# Patient Record
Sex: Female | Born: 1960
Health system: Southern US, Community
[De-identification: ages and names within clinical notes are randomized; demographics above are authoritative.]

## PROBLEM LIST (undated history)

## (undated) DIAGNOSIS — E119 Type 2 diabetes mellitus without complications: Secondary | ICD-10-CM

## (undated) DIAGNOSIS — E785 Hyperlipidemia, unspecified: Secondary | ICD-10-CM

## (undated) DIAGNOSIS — G4730 Sleep apnea, unspecified: Secondary | ICD-10-CM

## (undated) DIAGNOSIS — Z972 Presence of dental prosthetic device (complete) (partial): Secondary | ICD-10-CM

## (undated) HISTORY — DX: Type 2 diabetes mellitus without complications: E11.9

## (undated) HISTORY — DX: Hyperlipidemia, unspecified: E78.5

## (undated) HISTORY — PX: NO PAST SURGERIES: SHX2092

---

## 2017-04-06 DIAGNOSIS — H5213 Myopia, bilateral: Secondary | ICD-10-CM | POA: Diagnosis not present

## 2017-04-06 LAB — HM DIABETES EYE EXAM

## 2017-04-12 ENCOUNTER — Ambulatory Visit (INDEPENDENT_AMBULATORY_CARE_PROVIDER_SITE_OTHER): Payer: 59 | Admitting: Internal Medicine

## 2017-04-12 ENCOUNTER — Encounter: Payer: Self-pay | Admitting: Internal Medicine

## 2017-04-12 VITALS — BP 124/78 | HR 77 | Temp 98.3°F | Ht 67.0 in | Wt 260.0 lb

## 2017-04-12 DIAGNOSIS — Z1231 Encounter for screening mammogram for malignant neoplasm of breast: Secondary | ICD-10-CM

## 2017-04-12 DIAGNOSIS — H6123 Impacted cerumen, bilateral: Secondary | ICD-10-CM | POA: Diagnosis not present

## 2017-04-12 DIAGNOSIS — Z1239 Encounter for other screening for malignant neoplasm of breast: Secondary | ICD-10-CM

## 2017-04-12 DIAGNOSIS — E782 Mixed hyperlipidemia: Secondary | ICD-10-CM | POA: Diagnosis not present

## 2017-04-12 DIAGNOSIS — R7303 Prediabetes: Secondary | ICD-10-CM | POA: Diagnosis not present

## 2017-04-12 DIAGNOSIS — Z1211 Encounter for screening for malignant neoplasm of colon: Secondary | ICD-10-CM

## 2017-04-12 NOTE — Progress Notes (Signed)
Date:  04/12/2017   Name:  Karla Chapman   DOB:  01/19/1961   MRN:  [id number]   Chief Complaint: Establish Care (New to area- PCP needed in area. )  Prediabetes - she has tested borderline for A1c about 2 years ago.  She changed her diet but never needed medication.  She had lost some weight and has maintained at current weight for past 2 year.  HM - Pap smear was 2 years ago and was normal.  She intended to get a mammogram before moving here but did not.  She denied breast complaints. She has never had a colonoscopy.  She has no bowel problems and no personal hx of bowel disease or polyps, no family history of colon cancer or IBD.  Review of Systems  Constitutional: Negative for chills, fatigue, fever and unexpected weight change.  HENT: Negative for ear pain.   Eyes: Negative for visual disturbance.  Respiratory: Negative for chest tightness and shortness of breath.   Cardiovascular: Negative for chest pain, palpitations and leg swelling.  Gastrointestinal: Negative for abdominal pain, blood in stool, constipation and diarrhea.  Genitourinary: Negative for difficulty urinating and menstrual problem.  Musculoskeletal: Negative for arthralgias.  Skin: Negative for color change and rash.  Neurological: Negative for dizziness and headaches (hx of migraines - none in years).  Psychiatric/Behavioral: Negative for dysphoric mood and sleep disturbance.    There are no active problems to display for this patient.   Prior to Admission medications   Not on File    Allergies  Allergen Reactions  . Penicillins Rash    History reviewed. No pertinent surgical history.  Social History  Substance Use Topics  . Smoking status: Never Smoker  . Smokeless tobacco: Never Used  . Alcohol use Yes     Comment: 1-2 glasses of beer, or wine weekly     Medication list has been reviewed and updated.   Physical Exam  Constitutional: She is oriented to person, place, and time. She  appears well-developed. No distress.  HENT:  Head: Normocephalic and atraumatic.  Right Ear: No decreased hearing is noted.  Left Ear: No decreased hearing is noted.  Nose: Right sinus exhibits no maxillary sinus tenderness and no frontal sinus tenderness. Left sinus exhibits no maxillary sinus tenderness and no frontal sinus tenderness.  Mouth/Throat: No posterior oropharyngeal edema or posterior oropharyngeal erythema.  Cerumen impaction bilaterally  Eyes: Pupils are equal, round, and reactive to light.  Neck: Normal range of motion. Neck supple. Carotid bruit is not present. No thyromegaly present.  Cardiovascular: Normal rate, regular rhythm and normal heart sounds.   Pulmonary/Chest: Effort normal and breath sounds normal. No respiratory distress. She has no wheezes.  Musculoskeletal: Normal range of motion.  Neurological: She is alert and oriented to person, place, and time. She has normal strength. Coordination and gait normal.  Skin: Skin is warm and dry. No rash noted.  Psychiatric: She has a normal mood and affect. Her speech is normal and behavior is normal. Thought content normal.  Nursing note and vitals reviewed.   BP 124/78   Pulse 77   Temp 98.3 F (36.8 C)   Ht 5\' 7"  (1.702 m)   Wt 260 lb (117.9 kg)   SpO2 97%   BMI 40.72 kg/m   Assessment and Plan: 1. Prediabetes Continue low carb diet; reinforce regular exercise - Hemoglobin A1c  2. Mixed hyperlipidemia Noted in the past with low ASCVD risk Continue diet and exercise  3.  Impacted cerumen of both ears Asymptomatic - would ENT to remove cerumen  4. Breast cancer screening Pt to schedule [facility] - MM DIGITAL SCREENING BILATERAL; Future  5. Colon cancer screening Will order cologuard since pt is low risk - she will check on coverage before sending in specimen - Cologuard   No orders of the defined types were placed in this encounter.   Bari Edward, MD [facility]  04/12/2017

## 2017-04-13 LAB — HEMOGLOBIN A1C
ESTIMATED AVERAGE GLUCOSE: 143 mg/dL
HEMOGLOBIN A1C: 6.6 % — AB (ref 4.8–5.6)

## 2017-04-15 ENCOUNTER — Telehealth: Payer: Self-pay

## 2017-04-15 DIAGNOSIS — R7309 Other abnormal glucose: Secondary | ICD-10-CM

## 2017-04-15 NOTE — Telephone Encounter (Signed)
Called patient. Gave results and referral info.  Placed referral.Patient verbalized understanding.

## 2017-05-06 ENCOUNTER — Ambulatory Visit
Admission: RE | Admit: 2017-05-06 | Discharge: 2017-05-06 | Disposition: A | Discharge status: Home / Self Care | Payer: 59 | Source: Ambulatory Visit | Attending: Internal Medicine | Admitting: Internal Medicine

## 2017-05-06 DIAGNOSIS — Z1239 Encounter for other screening for malignant neoplasm of breast: Secondary | ICD-10-CM

## 2017-05-06 DIAGNOSIS — Z1231 Encounter for screening mammogram for malignant neoplasm of breast: Secondary | ICD-10-CM | POA: Diagnosis not present

## 2017-05-10 ENCOUNTER — Other Ambulatory Visit: Payer: Self-pay | Admitting: Internal Medicine

## 2017-05-10 DIAGNOSIS — R928 Other abnormal and inconclusive findings on diagnostic imaging of breast: Secondary | ICD-10-CM

## 2017-05-13 ENCOUNTER — Ambulatory Visit
Admission: RE | Admit: 2017-05-13 | Discharge: 2017-05-13 | Disposition: A | Discharge status: Home / Self Care | Payer: 59 | Source: Ambulatory Visit | Attending: Internal Medicine | Admitting: Internal Medicine

## 2017-05-13 ENCOUNTER — Other Ambulatory Visit: Payer: Self-pay | Admitting: Internal Medicine

## 2017-05-13 DIAGNOSIS — R928 Other abnormal and inconclusive findings on diagnostic imaging of breast: Secondary | ICD-10-CM | POA: Diagnosis not present

## 2017-05-13 DIAGNOSIS — N6324 Unspecified lump in the left breast, lower inner quadrant: Secondary | ICD-10-CM | POA: Diagnosis not present

## 2017-05-13 DIAGNOSIS — N6322 Unspecified lump in the left breast, upper inner quadrant: Secondary | ICD-10-CM | POA: Diagnosis not present

## 2017-07-23 ENCOUNTER — Encounter: Payer: Self-pay | Admitting: Internal Medicine

## 2017-07-25 ENCOUNTER — Other Ambulatory Visit: Payer: Self-pay | Admitting: Internal Medicine

## 2017-07-25 NOTE — Telephone Encounter (Signed)
Patient needs strips called in and lancets[place] Please Advise.

## 2017-07-27 ENCOUNTER — Other Ambulatory Visit: Payer: Self-pay

## 2017-07-27 MED ORDER — GLUCOSE BLOOD VI STRP: ORAL_STRIP | 12 refills | Status: DC

## 2017-08-08 ENCOUNTER — Encounter: Payer: Self-pay | Admitting: Internal Medicine

## 2017-08-08 ENCOUNTER — Other Ambulatory Visit: Payer: Self-pay

## 2017-08-08 MED ORDER — ACCU-CHEK FASTCLIX LANCETS MISC: 1.0000 | Freq: Two times a day (BID) | 3 refills | Status: DC

## 2017-08-19 ENCOUNTER — Ambulatory Visit (INDEPENDENT_AMBULATORY_CARE_PROVIDER_SITE_OTHER): Payer: 59 | Admitting: Internal Medicine

## 2017-08-19 ENCOUNTER — Encounter: Payer: Self-pay | Admitting: Internal Medicine

## 2017-08-19 VITALS — BP 122/68 | HR 65 | Ht 67.0 in | Wt 255.0 lb

## 2017-08-19 DIAGNOSIS — E119 Type 2 diabetes mellitus without complications: Secondary | ICD-10-CM

## 2017-08-19 DIAGNOSIS — E118 Type 2 diabetes mellitus with unspecified complications: Secondary | ICD-10-CM | POA: Insufficient documentation

## 2017-08-19 DIAGNOSIS — Z1211 Encounter for screening for malignant neoplasm of colon: Secondary | ICD-10-CM

## 2017-08-19 MED ORDER — LISINOPRIL 5 MG PO TABS: 5.0000 mg | ORAL_TABLET | Freq: Every day | ORAL | 3 refills | Status: DC

## 2017-08-19 MED ORDER — GLUCOSE BLOOD VI STRP: ORAL_STRIP | 12 refills | Status: DC

## 2017-08-19 NOTE — Progress Notes (Signed)
Date:  08/19/2017   Name:  Karla Chapman   DOB:  10/16/1960   MRN:  [id number]   Chief Complaint: Hyperlipidemia and Diabetes Diabetes  She presents for her follow-up diabetic visit. She has type 2 diabetes mellitus. Pertinent negatives for diabetes include no chest pain, no fatigue, no foot paresthesias and no polyuria. Symptoms are improving. Current diabetic treatment includes diet. She is following a diabetic diet. She has not had a previous visit with a dietitian. She participates in exercise three times a week. She monitors blood glucose at home 1-2 x per day. Blood glucose monitoring compliance is good. Her home blood glucose trend is fluctuating minimally. Her breakfast blood glucose is taken between 9-10 am. Her breakfast blood glucose range is generally 130-140 mg/dl. An ACE inhibitor/angiotensin II receptor blocker is not being taken.      Review of Systems  Constitutional: Negative for chills, fatigue and fever.  Eyes: Negative for visual disturbance.  Respiratory: Negative for cough, chest tightness, shortness of breath and wheezing.   Cardiovascular: Negative for chest pain and leg swelling.  Endocrine: Negative for polyuria.  Musculoskeletal: Negative for arthralgias and gait problem.  Skin: Negative for rash and wound.  Psychiatric/Behavioral: Negative for sleep disturbance.    Patient Active Problem List   Diagnosis Date Noted  . Controlled type 2 diabetes mellitus without complication, without long-term current use of insulin (HCC) 08/19/2017    Prior to Admission medications   Medication Sig Start Date End Date Taking? Authorizing Provider  ACCU-CHEK FASTCLIX LANCETS MISC 1 each by Does not apply route 2 (two) times daily. 08/08/17  Yes Reubin MilanBerglund, Laelynn Blizzard H, MD  glucose blood (ACCU-CHEK GUIDE) test strip Test BS twice a day 07/27/17  Yes Reubin MilanBerglund, Pietrina Jagodzinski H, MD    Allergies  Allergen Reactions  . Penicillins Rash    History reviewed. No pertinent surgical  history.  Social History   Tobacco Use  . Smoking status: Never Smoker  . Smokeless tobacco: Never Used  Substance Use Topics  . Alcohol use: Yes    Comment: 1-2 glasses of beer, or wine weekly   . Drug use: No     Medication list has been reviewed and updated.  PHQ 2/9 Scores 08/19/2017  PHQ - 2 Score 0    Physical Exam  Constitutional: She is oriented to person, place, and time. She appears well-developed. No distress.  HENT:  Head: Normocephalic and atraumatic.  Neck: Normal range of motion. Neck supple. No thyromegaly present.  Cardiovascular: Normal rate, regular rhythm and normal heart sounds.  Pulmonary/Chest: Effort normal and breath sounds normal. No respiratory distress.  Musculoskeletal: Normal range of motion. She exhibits no edema or tenderness.  Neurological: She is alert and oriented to person, place, and time.  Skin: Skin is warm and dry. No rash noted.  Psychiatric: She has a normal mood and affect. Her behavior is normal. Thought content normal.  Nursing note and vitals reviewed.   BP 122/68   Pulse 65   Ht 5\' 7"  (1.702 m)   Wt 255 lb (115.7 kg)   SpO2 96%   BMI 39.94 kg/m   Assessment and Plan: 1. Controlled type 2 diabetes mellitus without complication, without long-term current use of insulin (HCC) Continue diet and exercise Add ASA and ACEI Consider statin - Hemoglobin A1c - Lipid panel - Comprehensive metabolic panel - Microalbumin / creatinine urine ratio - lisinopril (PRINIVIL,ZESTRIL) 5 MG tablet; Take 1 tablet (5 mg total) by mouth daily.  Dispense:  90 tablet; Refill: 3 - glucose blood (ACCU-CHEK GUIDE) test strip; Test BS twice a day  Dispense: 100 each; Refill: 12  2. Colon cancer screening Check on coverage - Cologuard   Meds ordered this encounter  Medications  . lisinopril (PRINIVIL,ZESTRIL) 5 MG tablet    Sig: Take 1 tablet (5 mg total) by mouth daily.    Dispense:  90 tablet    Refill:  3  . glucose blood (ACCU-CHEK  GUIDE) test strip    Sig: Test BS twice a day    Dispense:  100 each    Refill:  12    Partially dictated using Animal nutritionistDragon software. Any errors are unintentional.  Bari EdwardLaura Srikar Chiang, MD [facility]  08/19/2017

## 2017-08-19 NOTE — Patient Instructions (Signed)
Begin Aspirin 81mg daily

## 2017-08-20 LAB — HEMOGLOBIN A1C
Est. average glucose Bld gHb Est-mCnc: 134 mg/dL
Hgb A1c MFr Bld: 6.3 % — ABNORMAL HIGH (ref 4.8–5.6)

## 2017-08-20 LAB — COMPREHENSIVE METABOLIC PANEL
A/G RATIO: 1.7 (ref 1.2–2.2)
ALK PHOS: 86 IU/L (ref 39–117)
ALT: 25 IU/L (ref 0–32)
AST: 19 IU/L (ref 0–40)
Albumin: 4.7 g/dL (ref 3.5–5.5)
BILIRUBIN TOTAL: 0.4 mg/dL (ref 0.0–1.2)
BUN/Creatinine Ratio: 19 (ref 9–23)
BUN: 15 mg/dL (ref 6–24)
CALCIUM: 9.7 mg/dL (ref 8.7–10.2)
CHLORIDE: 101 mmol/L (ref 96–106)
CO2: 24 mmol/L (ref 20–29)
Creatinine, Ser: 0.81 mg/dL (ref 0.57–1.00)
GFR calc Af Amer: 95 mL/min/{1.73_m2} (ref >59)
GFR calc non Af Amer: 82 mL/min/{1.73_m2} (ref >59)
Globulin, Total: 2.7 g/dL (ref 1.5–4.5)
Glucose: 97 mg/dL (ref 65–99)
POTASSIUM: 4.4 mmol/L (ref 3.5–5.2)
Sodium: 138 mmol/L (ref 134–144)
Total Protein: 7.4 g/dL (ref 6.0–8.5)

## 2017-08-20 LAB — LIPID PANEL
CHOL/HDL RATIO: 4.4 ratio (ref 0.0–4.4)
Cholesterol, Total: 230 mg/dL — ABNORMAL HIGH (ref 100–199)
HDL: 52 mg/dL (ref >39)
LDL CALC: 140 mg/dL — AB (ref 0–99)
TRIGLYCERIDES: 190 mg/dL — AB (ref 0–149)
VLDL CHOLESTEROL CAL: 38 mg/dL (ref 5–40)

## 2017-08-23 NOTE — Progress Notes (Signed)
Patient was not fasting. Would like to recheck while fasting.

## 2017-08-24 ENCOUNTER — Other Ambulatory Visit: Payer: Self-pay

## 2017-08-24 DIAGNOSIS — E785 Hyperlipidemia, unspecified: Secondary | ICD-10-CM

## 2017-09-01 DIAGNOSIS — Z1212 Encounter for screening for malignant neoplasm of rectum: Secondary | ICD-10-CM | POA: Diagnosis not present

## 2017-09-02 LAB — COLOGUARD

## 2017-09-09 ENCOUNTER — Encounter: Payer: Self-pay | Admitting: Internal Medicine

## 2017-11-11 ENCOUNTER — Other Ambulatory Visit: Payer: Self-pay | Admitting: Internal Medicine

## 2017-11-11 ENCOUNTER — Ambulatory Visit
Admission: RE | Admit: 2017-11-11 | Discharge: 2017-11-11 | Disposition: A | Discharge status: Home / Self Care | Payer: 59 | Source: Ambulatory Visit | Attending: Internal Medicine | Admitting: Internal Medicine

## 2017-11-11 DIAGNOSIS — N6320 Unspecified lump in the left breast, unspecified quadrant: Secondary | ICD-10-CM

## 2017-11-11 DIAGNOSIS — R928 Other abnormal and inconclusive findings on diagnostic imaging of breast: Secondary | ICD-10-CM

## 2017-12-09 ENCOUNTER — Encounter (HOSPITAL_COMMUNITY): Payer: Self-pay | Admitting: *Deleted

## 2017-12-09 ENCOUNTER — Other Ambulatory Visit: Payer: Self-pay

## 2017-12-09 ENCOUNTER — Emergency Department (HOSPITAL_COMMUNITY): Payer: 59

## 2017-12-09 ENCOUNTER — Emergency Department (HOSPITAL_COMMUNITY)
Admission: EM | Admit: 2017-12-09 | Discharge: 2017-12-09 | Disposition: A | Discharge status: Home / Self Care | Payer: 59 | Attending: Emergency Medicine | Admitting: Emergency Medicine

## 2017-12-09 DIAGNOSIS — M25512 Pain in left shoulder: Secondary | ICD-10-CM | POA: Diagnosis not present

## 2017-12-09 DIAGNOSIS — Z79899 Other long term (current) drug therapy: Secondary | ICD-10-CM | POA: Diagnosis not present

## 2017-12-09 DIAGNOSIS — E119 Type 2 diabetes mellitus without complications: Secondary | ICD-10-CM | POA: Diagnosis not present

## 2017-12-09 DIAGNOSIS — Z7982 Long term (current) use of aspirin: Secondary | ICD-10-CM | POA: Insufficient documentation

## 2017-12-09 DIAGNOSIS — S4992XA Unspecified injury of left shoulder and upper arm, initial encounter: Secondary | ICD-10-CM | POA: Diagnosis not present

## 2017-12-09 DIAGNOSIS — Y999 Unspecified external cause status: Secondary | ICD-10-CM | POA: Insufficient documentation

## 2017-12-09 DIAGNOSIS — Y92410 Unspecified street and highway as the place of occurrence of the external cause: Secondary | ICD-10-CM | POA: Insufficient documentation

## 2017-12-09 DIAGNOSIS — Y939 Activity, unspecified: Secondary | ICD-10-CM | POA: Insufficient documentation

## 2017-12-09 MED ORDER — DICLOFENAC SODIUM 50 MG PO TBEC: 50.0000 mg | DELAYED_RELEASE_TABLET | Freq: Two times a day (BID) | ORAL | 0 refills | Status: DC

## 2017-12-09 MED ORDER — CYCLOBENZAPRINE HCL 10 MG PO TABS: 10.0000 mg | ORAL_TABLET | Freq: Two times a day (BID) | ORAL | 0 refills | Status: DC | PRN
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
## 2017-12-09 NOTE — Discharge Instructions (Signed)
Do not drive while taking the muscle relaxer as it will make you sleepy. Follow up with  your doctor or return here for worsening symptoms.  °
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
## 2017-12-30 ENCOUNTER — Ambulatory Visit: Payer: 59 | Admitting: Internal Medicine

## 2018-02-27 ENCOUNTER — Encounter: Payer: 59 | Admitting: Internal Medicine

## 2018-04-14 ENCOUNTER — Encounter: Payer: 59 | Admitting: Internal Medicine

## 2018-04-18 ENCOUNTER — Ambulatory Visit (INDEPENDENT_AMBULATORY_CARE_PROVIDER_SITE_OTHER): Payer: 59 | Admitting: Internal Medicine

## 2018-04-18 ENCOUNTER — Encounter: Payer: Self-pay | Admitting: Internal Medicine

## 2018-04-18 ENCOUNTER — Other Ambulatory Visit (HOSPITAL_COMMUNITY)
Admission: RE | Admit: 2018-04-18 | Discharge: 2018-04-18 | Disposition: A | Discharge status: Home / Self Care | Payer: 59 | Source: Ambulatory Visit | Attending: Internal Medicine | Admitting: Internal Medicine

## 2018-04-18 ENCOUNTER — Other Ambulatory Visit: Payer: Self-pay | Admitting: Internal Medicine

## 2018-04-18 VITALS — BP 118/74 | HR 85 | Ht 67.0 in | Wt 257.0 lb

## 2018-04-18 DIAGNOSIS — Z124 Encounter for screening for malignant neoplasm of cervix: Secondary | ICD-10-CM

## 2018-04-18 DIAGNOSIS — Z23 Encounter for immunization: Secondary | ICD-10-CM

## 2018-04-18 DIAGNOSIS — E119 Type 2 diabetes mellitus without complications: Secondary | ICD-10-CM | POA: Diagnosis not present

## 2018-04-18 DIAGNOSIS — Z01419 Encounter for gynecological examination (general) (routine) without abnormal findings: Secondary | ICD-10-CM

## 2018-04-18 DIAGNOSIS — Z0000 Encounter for general adult medical examination without abnormal findings: Secondary | ICD-10-CM

## 2018-04-18 LAB — POCT URINALYSIS DIPSTICK
Bilirubin, UA: NEGATIVE
Blood, UA: NEGATIVE
GLUCOSE UA: NEGATIVE
Ketones, UA: NEGATIVE
LEUKOCYTES UA: NEGATIVE
Nitrite, UA: NEGATIVE
PH UA: 7 (ref 5.0–8.0)
Protein, UA: NEGATIVE
Spec Grav, UA: 1.015 (ref 1.010–1.025)
Urobilinogen, UA: 0.2 E.U./dL

## 2018-04-18 MED ORDER — ZOSTER VAC RECOMB ADJUVANTED 50 MCG/0.5ML IM SUSR: 0.5000 mL | Freq: Once | INTRAMUSCULAR | 1 refills | Status: AC

## 2018-04-18 NOTE — Patient Instructions (Signed)

## 2018-04-18 NOTE — Progress Notes (Signed)
Date:  04/18/2018   Name:  Karla Chapman   DOB:  09/06/1960   MRN:  [id number]   Chief Complaint: Annual Exam (Papsmear. ) Karla Chapman is a 57 y.o. female who presents today for her Complete Annual Exam. She feels well. She reports exercising walking 10K steps/day. She reports she is sleeping well. No breast complaints.  She is due for a pap smear.  She is scheduled for 6 mo mammogram and US next month.   Diabetes  She presents for her follow-up diabetic visit. She has type 2 diabetes mellitus. Her disease course has been stable. Pertinent negatives for hypoglycemia include no dizziness, headaches, nervousness/anxiousness or tremors. Pertinent negatives for diabetes include no chest pain, no fatigue, no polydipsia and no polyuria. Current diabetic treatment includes diet. Her weight is stable. She is following a generally healthy diet. She monitors blood glucose at home 1-2 x per week. Her breakfast blood glucose is taken between 7-8 am. Her breakfast blood glucose range is generally 110-130 mg/dl. An ACE inhibitor/angiotensin II receptor blocker is not being taken.   Lab Results  Component Value Date   HGBA1C 6.3 (H) 08/19/2017     Review of Systems  Constitutional: Negative for chills, fatigue and fever.  HENT: Negative for congestion, hearing loss, tinnitus, trouble swallowing and voice change.   Eyes: Negative for visual disturbance.  Respiratory: Negative for cough, chest tightness, shortness of breath and wheezing.   Cardiovascular: Negative for chest pain, palpitations and leg swelling.  Gastrointestinal: Negative for abdominal pain, constipation, diarrhea and vomiting.  Endocrine: Negative for polydipsia and polyuria.  Genitourinary: Negative for dysuria, frequency, genital sores, vaginal bleeding and vaginal discharge.  Musculoskeletal: Negative for arthralgias, gait problem and joint swelling.  Skin: Negative for color change and rash.  Neurological: Negative for  dizziness, tremors, light-headedness and headaches.  Hematological: Negative for adenopathy. Does not bruise/bleed easily.  Psychiatric/Behavioral: Negative for dysphoric mood and sleep disturbance. The patient is not nervous/anxious.     Patient Active Problem List   Diagnosis Date Noted  . Controlled type 2 diabetes mellitus without complication, without long-term current use of insulin (HCC) 08/19/2017    Allergies  Allergen Reactions  . Penicillins Rash    History reviewed. No pertinent surgical history.  Social History   Tobacco Use  . Smoking status: Never Smoker  . Smokeless tobacco: Never Used  Substance Use Topics  . Alcohol use: Yes    Comment: 1-2 glasses of beer, or wine weekly   . Drug use: No     Medication list has been reviewed and updated.  Current Meds  Medication Sig  . ACCU-CHEK FASTCLIX LANCETS MISC 1 each by Does not apply route 2 (two) times daily.  Marland Kitchen. aspirin EC 81 MG tablet Take 81 mg by mouth daily.  Marland Kitchen. glucose blood (ACCU-CHEK GUIDE) test strip Test BS twice a day    PHQ 2/9 Scores 04/18/2018 08/19/2017  PHQ - 2 Score 0 0    Physical Exam  Constitutional: She is oriented to person, place, and time. She appears well-developed and well-nourished. No distress.  HENT:  Head: Normocephalic and atraumatic.  Right Ear: Tympanic membrane and ear canal normal.  Left Ear: Tympanic membrane and ear canal normal.  Nose: Right sinus exhibits no maxillary sinus tenderness. Left sinus exhibits no maxillary sinus tenderness.  Mouth/Throat: Uvula is midline and oropharynx is clear and moist.  Eyes: Conjunctivae and EOM are normal. Right eye exhibits no discharge. Left eye exhibits no  discharge. No scleral icterus.  Neck: Normal range of motion. Carotid bruit is not present. No erythema present. No thyromegaly present.  Cardiovascular: Normal rate, regular rhythm, normal heart sounds and normal pulses.  Pulmonary/Chest: Effort normal. No respiratory  distress. She has no wheezes.  Abdominal: Soft. Bowel sounds are normal. There is no hepatosplenomegaly. There is no tenderness. There is no CVA tenderness.  Genitourinary: Vagina normal and uterus normal. There is no rash, tenderness or lesion on the right labia. There is no rash, tenderness or lesion on the left labia. Cervix exhibits no motion tenderness, no discharge and no friability. Right adnexum displays no mass, no tenderness and no fullness. Left adnexum displays no mass, no tenderness and no fullness.  Musculoskeletal: Normal range of motion.  Lymphadenopathy:    She has no cervical adenopathy.    She has no axillary adenopathy.  Neurological: She is alert and oriented to person, place, and time. She has normal reflexes. No cranial nerve deficit or sensory deficit.  Skin: Skin is warm, dry and intact. No rash noted.  Psychiatric: She has a normal mood and affect. Her speech is normal and behavior is normal. Thought content normal.  Nursing note and vitals reviewed.   BP 118/74   Pulse 85   Ht 5\' 7"  (1.702 m)   Wt 257 lb (116.6 kg)   SpO2 97%   BMI 40.25 kg/m   Assessment and Plan: 1. Annual physical exam Continue regular exercise, work on weight loss Follow up mammogram and US next month - POCT urinalysis dipstick  2. Encounter for Papanicolaou smear for cervical cancer screening - Cytology - PAP - Microalbumin / creatinine urine ratio  3. Controlled type 2 diabetes mellitus without complication, without long-term current use of insulin (HCC) Check labs and advise - CBC with Differential/Platelet - Comprehensive metabolic panel - Hemoglobin A1c - Lipid panel - TSH  4. Need for shingles vaccine - Zoster Vaccine Adjuvanted [facility](SHINGRIX) injection; Inject 0.5 mLs into the muscle once for 1 dose.  Dispense: 0.5 mL; Refill: 1  5. Need for pneumococcal vaccination - Pneumococcal polysaccharide vaccine 23-valent greater than or equal to 2yo subcutaneous/IM   Meds  ordered this encounter  Medications  . Zoster Vaccine Adjuvanted [facility](SHINGRIX) injection    Sig: Inject 0.5 mLs into the muscle once for 1 dose.    Dispense:  0.5 mL    Refill:  1    Partially dictated using Animal nutritionistDragon software. Any errors are unintentional.  Bari EdwardLaura Berglund, MD [facility]  04/18/2018

## 2018-04-19 DIAGNOSIS — E119 Type 2 diabetes mellitus without complications: Secondary | ICD-10-CM | POA: Diagnosis not present

## 2018-04-19 LAB — CYTOLOGY - PAP: Diagnosis: NEGATIVE

## 2018-04-20 LAB — CBC WITH DIFFERENTIAL/PLATELET
BASOS: 1 %
Basophils Absolute: 0 10*3/uL (ref 0.0–0.2)
EOS (ABSOLUTE): 0.3 10*3/uL (ref 0.0–0.4)
EOS: 5 %
HEMATOCRIT: 37.5 % (ref 34.0–46.6)
HEMOGLOBIN: 12.6 g/dL (ref 11.1–15.9)
IMMATURE GRANULOCYTES: 0 %
Immature Grans (Abs): 0 10*3/uL (ref 0.0–0.1)
Lymphocytes Absolute: 2 10*3/uL (ref 0.7–3.1)
Lymphs: 38 %
MCH: 30 pg (ref 26.6–33.0)
MCHC: 33.6 g/dL (ref 31.5–35.7)
MCV: 89 fL (ref 79–97)
MONOS ABS: 0.6 10*3/uL (ref 0.1–0.9)
Monocytes: 11 %
Neutrophils Absolute: 2.4 10*3/uL (ref 1.4–7.0)
Neutrophils: 45 %
Platelets: 281 10*3/uL (ref 150–450)
RBC: 4.2 x10E6/uL (ref 3.77–5.28)
RDW: 15.4 % (ref 12.3–15.4)
WBC: 5.2 10*3/uL (ref 3.4–10.8)

## 2018-04-20 LAB — COMPREHENSIVE METABOLIC PANEL
ALBUMIN: 4.3 g/dL (ref 3.5–5.5)
ALT: 36 IU/L — ABNORMAL HIGH (ref 0–32)
AST: 28 IU/L (ref 0–40)
Albumin/Globulin Ratio: 1.8 (ref 1.2–2.2)
Alkaline Phosphatase: 81 IU/L (ref 39–117)
BUN / CREAT RATIO: 12 (ref 9–23)
BUN: 10 mg/dL (ref 6–24)
Bilirubin Total: 0.5 mg/dL (ref 0.0–1.2)
CALCIUM: 9.3 mg/dL (ref 8.7–10.2)
CO2: 22 mmol/L (ref 20–29)
CREATININE: 0.85 mg/dL (ref 0.57–1.00)
Chloride: 105 mmol/L (ref 96–106)
GFR calc Af Amer: 89 mL/min/{1.73_m2} (ref >59)
GFR, EST NON AFRICAN AMERICAN: 77 mL/min/{1.73_m2} (ref >59)
GLOBULIN, TOTAL: 2.4 g/dL (ref 1.5–4.5)
Glucose: 149 mg/dL — ABNORMAL HIGH (ref 65–99)
POTASSIUM: 4.6 mmol/L (ref 3.5–5.2)
SODIUM: 139 mmol/L (ref 134–144)
Total Protein: 6.7 g/dL (ref 6.0–8.5)

## 2018-04-20 LAB — HEMOGLOBIN A1C
ESTIMATED AVERAGE GLUCOSE: 137 mg/dL
Hgb A1c MFr Bld: 6.4 % — ABNORMAL HIGH (ref 4.8–5.6)

## 2018-04-20 LAB — LIPID PANEL
CHOL/HDL RATIO: 4.4 ratio (ref 0.0–4.4)
Cholesterol, Total: 208 mg/dL — ABNORMAL HIGH (ref 100–199)
HDL: 47 mg/dL (ref >39)
LDL CALC: 139 mg/dL — AB (ref 0–99)
Triglycerides: 111 mg/dL (ref 0–149)
VLDL CHOLESTEROL CAL: 22 mg/dL (ref 5–40)

## 2018-04-20 LAB — TSH: TSH: 1.57 u[IU]/mL (ref 0.450–4.500)

## 2018-04-21 LAB — MICROALBUMIN / CREATININE URINE RATIO
CREATININE, UR: 128.3 mg/dL
MICROALB/CREAT RATIO: 10.4 mg/g{creat} (ref 0.0–30.0)
Microalbumin, Urine: 13.3 ug/mL

## 2018-05-15 ENCOUNTER — Ambulatory Visit
Admission: RE | Admit: 2018-05-15 | Discharge: 2018-05-15 | Disposition: A | Discharge status: Home / Self Care | Payer: 59 | Source: Ambulatory Visit | Attending: Internal Medicine | Admitting: Internal Medicine

## 2018-05-15 DIAGNOSIS — N6320 Unspecified lump in the left breast, unspecified quadrant: Secondary | ICD-10-CM

## 2018-05-15 DIAGNOSIS — N6001 Solitary cyst of right breast: Secondary | ICD-10-CM | POA: Diagnosis not present

## 2018-05-15 DIAGNOSIS — R928 Other abnormal and inconclusive findings on diagnostic imaging of breast: Secondary | ICD-10-CM | POA: Diagnosis not present

## 2018-10-20 ENCOUNTER — Encounter: Payer: Self-pay | Admitting: Internal Medicine

## 2018-10-20 ENCOUNTER — Other Ambulatory Visit: Payer: Self-pay

## 2018-10-20 ENCOUNTER — Ambulatory Visit (INDEPENDENT_AMBULATORY_CARE_PROVIDER_SITE_OTHER): Payer: 59 | Admitting: Internal Medicine

## 2018-10-20 VITALS — BP 128/72 | HR 97 | Ht 67.0 in | Wt 266.3 lb

## 2018-10-20 DIAGNOSIS — E119 Type 2 diabetes mellitus without complications: Secondary | ICD-10-CM | POA: Diagnosis not present

## 2018-10-20 NOTE — Progress Notes (Signed)
Date:  10/20/2018   Name:  Karla Chapman   DOB:  1961/05/19   MRN:  [id number]   Chief Complaint: Diabetes  Diabetes  She presents for her follow-up diabetic visit. She has type 2 diabetes mellitus. Her disease course has been stable. Pertinent negatives for hypoglycemia include no headaches or tremors. Pertinent negatives for diabetes include no chest pain, no fatigue, no polydipsia and no polyuria. Her weight is stable. She is following a generally healthy diet. She participates in exercise three times a week. An ACE inhibitor/angiotensin II receptor blocker is not being taken. Eye exam is not current.   Lab Results  Component Value Date   HGBA1C 6.4 (H) 04/19/2018     Review of Systems  Constitutional: Negative for appetite change, fatigue, fever and unexpected weight change.  HENT: Negative for tinnitus and trouble swallowing.   Eyes: Negative for visual disturbance.  Respiratory: Negative for cough, chest tightness and shortness of breath.   Cardiovascular: Negative for chest pain, palpitations and leg swelling.  Gastrointestinal: Negative for abdominal pain.  Endocrine: Negative for polydipsia and polyuria.  Genitourinary: Negative for dysuria and hematuria.  Musculoskeletal: Negative for arthralgias.  Neurological: Negative for tremors, numbness and headaches.  Psychiatric/Behavioral: Negative for dysphoric mood.    Patient Active Problem List   Diagnosis Date Noted  . Controlled type 2 diabetes mellitus without complication, without long-term current use of insulin (HCC) 08/19/2017    Allergies  Allergen Reactions  . Penicillins Rash    History reviewed. No pertinent surgical history.  Social History   Tobacco Use  . Smoking status: Never Smoker  . Smokeless tobacco: Never Used  Substance Use Topics  . Alcohol use: Yes    Comment: 1-2 glasses of beer, or wine weekly   . Drug use: No     Medication list has been reviewed and updated.  Current  Meds  Medication Sig  . ACCU-CHEK FASTCLIX LANCETS MISC 1 each by Does not apply route 2 (two) times daily.  . Garlic (GARLIQUE PO) Take by mouth.  Marland Kitchen glucose blood (ACCU-CHEK GUIDE) test strip Test BS twice a day  . Omega-3 Fatty Acids (FISH OIL) 1000 MG CPDR Take by mouth.    PHQ 2/9 Scores 10/20/2018 04/18/2018 08/19/2017  PHQ - 2 Score 0 0 0   Wt Readings from Last 3 Encounters:  10/20/18 266 lb 4.8 oz (120.8 kg)  04/18/18 257 lb (116.6 kg)  08/19/17 255 lb (115.7 kg)    Physical Exam Vitals signs and nursing note reviewed.  Constitutional:      General: She is not in acute distress.    Appearance: She is well-developed.  HENT:     Head: Normocephalic and atraumatic.  Neck:     Musculoskeletal: Normal range of motion and neck supple.     Vascular: No carotid bruit.  Cardiovascular:     Rate and Rhythm: Normal rate and regular rhythm.     Pulses: Normal pulses.  Pulmonary:     Effort: Pulmonary effort is normal. No respiratory distress.     Breath sounds: Normal breath sounds.  Musculoskeletal: Normal range of motion.     Right lower leg: No edema.     Left lower leg: No edema.  Lymphadenopathy:     Cervical: No cervical adenopathy.  Skin:    General: Skin is warm and dry.     Findings: No rash.  Neurological:     Mental Status: She is alert and oriented to person, place,  and time.  Psychiatric:        Behavior: Behavior normal.        Thought Content: Thought content normal.     BP 128/72   Pulse 97   Ht 5\' 7"  (1.702 m)   Wt 266 lb 4.8 oz (120.8 kg)   SpO2 96%   BMI 41.71 kg/m   Assessment and Plan: 1. Controlled type 2 diabetes mellitus without complication, without long-term current use of insulin (HCC) Continue healthy diet and exercise Work on weight loss Schedule DM eye exam - Hemoglobin A1c - Basic metabolic panel   Partially dictated using Animal nutritionist. Any errors are unintentional.  Bari Edward, MD [facility]  10/20/2018

## 2018-10-21 LAB — BASIC METABOLIC PANEL
BUN/Creatinine Ratio: 13 (ref 9–23)
BUN: 11 mg/dL (ref 6–24)
CO2: 23 mmol/L (ref 20–29)
CREATININE: 0.84 mg/dL (ref 0.57–1.00)
Calcium: 9.3 mg/dL (ref 8.7–10.2)
Chloride: 102 mmol/L (ref 96–106)
GFR calc Af Amer: 90 mL/min/{1.73_m2} (ref >59)
GFR, EST NON AFRICAN AMERICAN: 78 mL/min/{1.73_m2} (ref >59)
Glucose: 199 mg/dL — ABNORMAL HIGH (ref 65–99)
Potassium: 4.4 mmol/L (ref 3.5–5.2)
Sodium: 139 mmol/L (ref 134–144)

## 2018-10-21 LAB — HEMOGLOBIN A1C
Est. average glucose Bld gHb Est-mCnc: 166 mg/dL
Hgb A1c MFr Bld: 7.4 % — ABNORMAL HIGH (ref 4.8–5.6)

## 2019-03-14 IMAGING — US ULTRASOUND LEFT BREAST LIMITED
1 series · 5 of 5 positions shown · non-contrast
Comparison: Previous exam(s).
COMPARISON: Previous exam(s).
COMPARISON: Previous exam(s).

Addendum:
CLINICAL DATA: Patient's physician palpated an abnormality in the 1
o'clock region of the right breast.

EXAM:
DIGITAL DIAGNOSTIC BILATERAL MAMMOGRAM WITH CAD AND TOMO
ULTRASOUND RIGHT BREAST
CLINICAL DATA: Short-term interval follow-up of a probable benign
mass in the left breast.
ULTRASOUND LEFT BREAST

[Series 1: ultrasound left breast limited · 0.06mm/px · 5 of 5 slices shown]
[im 1/5]
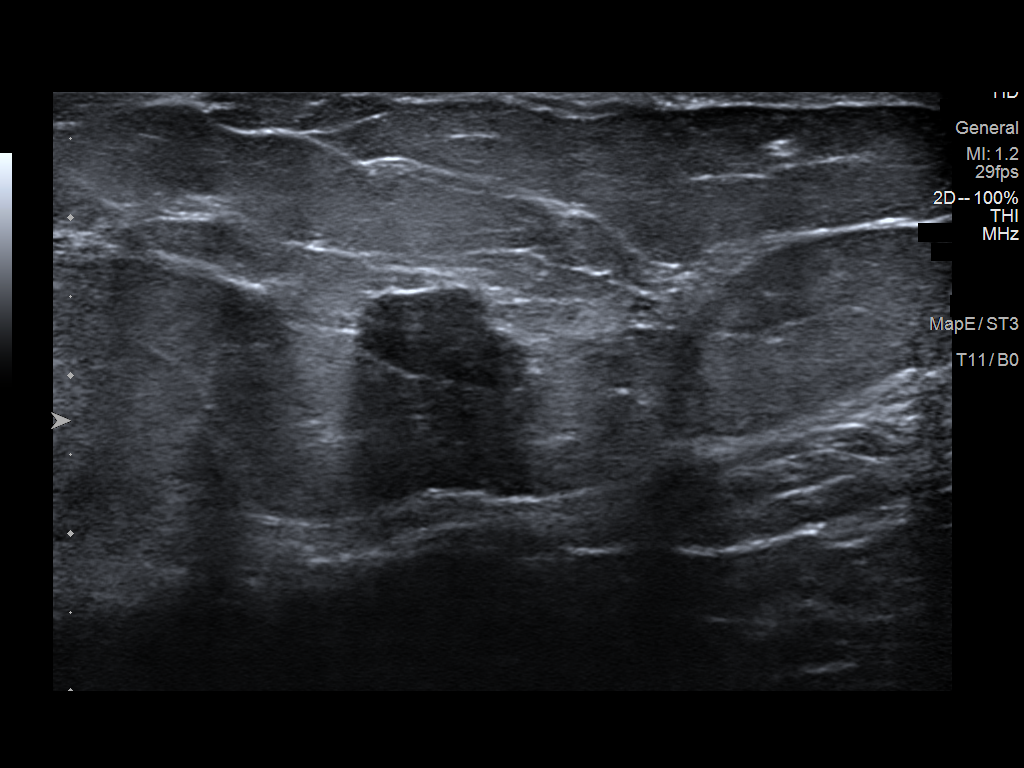
[im 2/5]
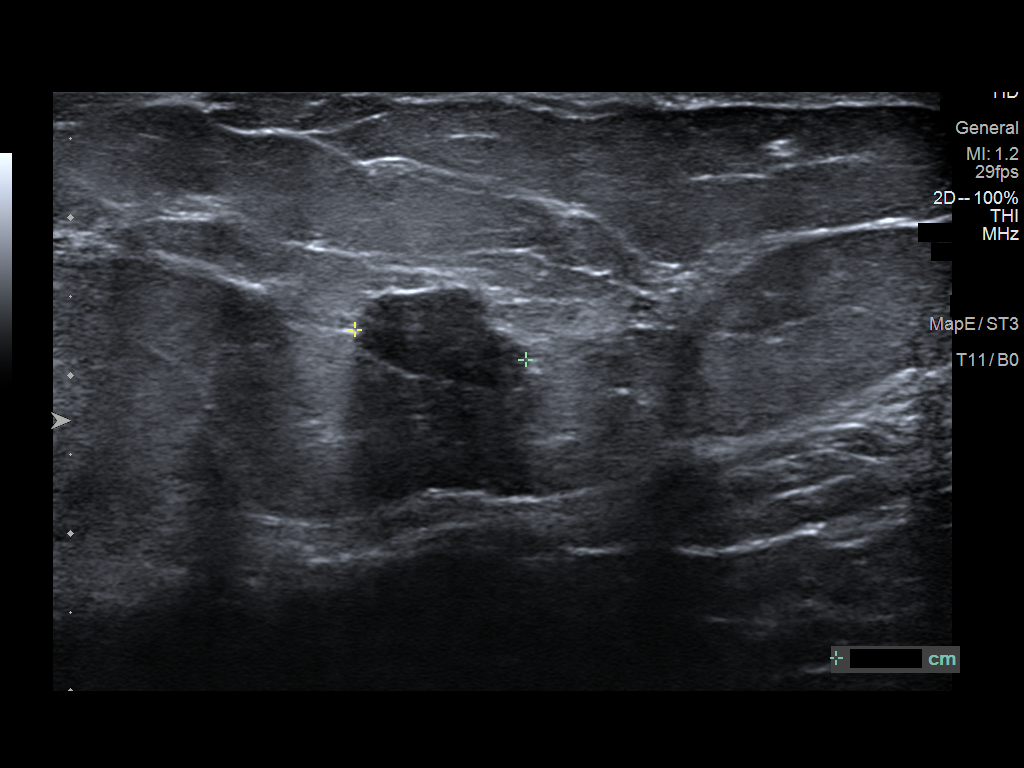
[im 3/5]
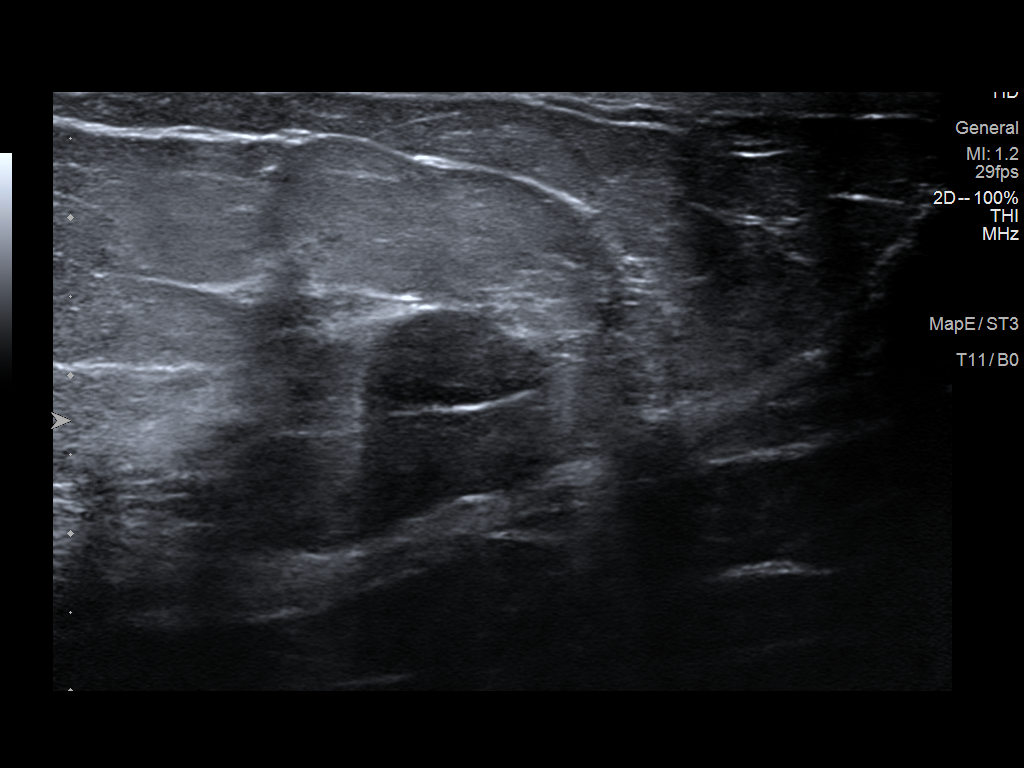
[im 4/5]
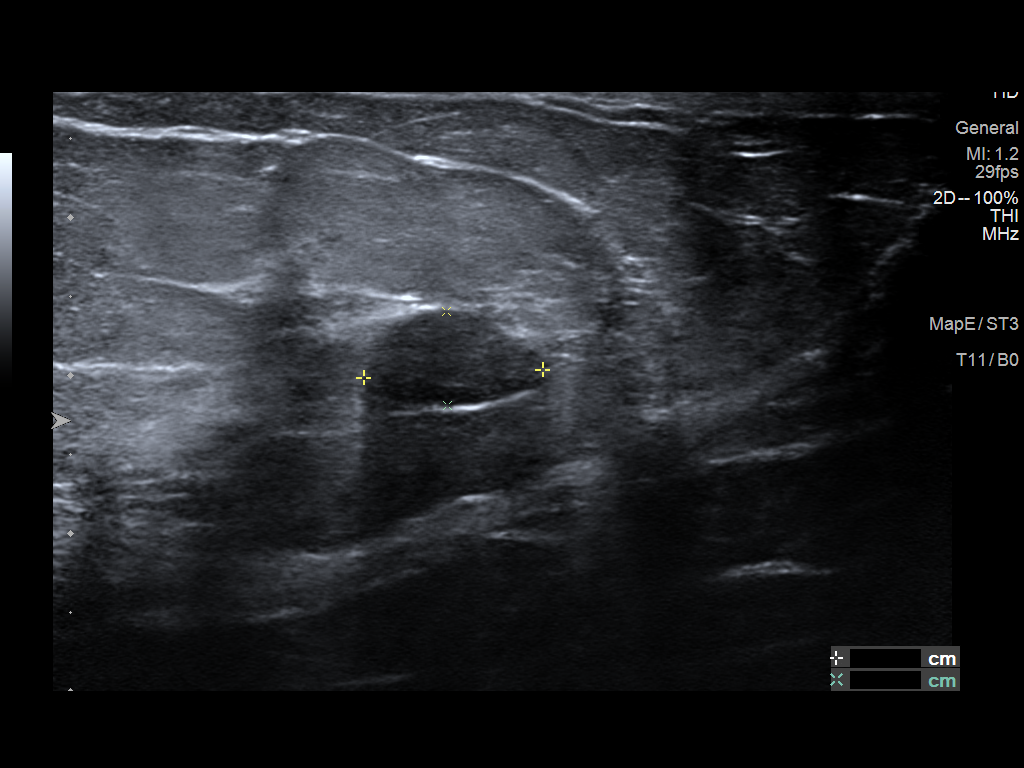
[im 5/5]
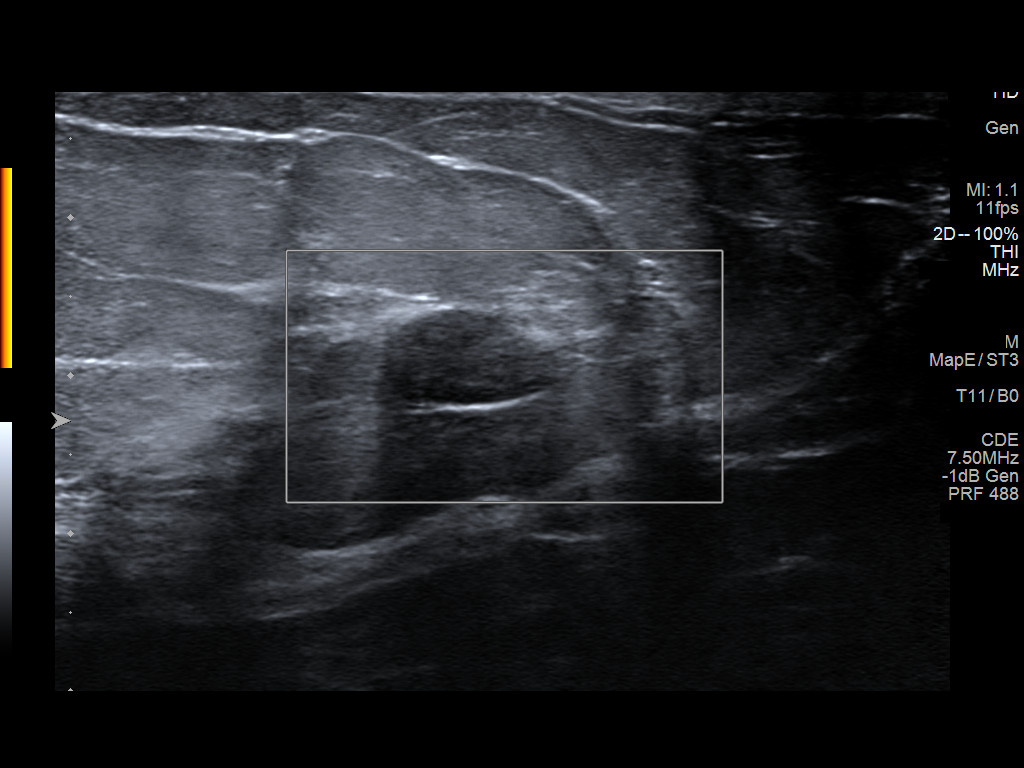

[5 of 5 positions shown; findings below may reference images not displayed]

ACR Breast Density Category c: The breast tissue is heterogeneously
dense, which may obscure small masses.
FINDINGS: There is a well-circumscribed 6 mm mass in the upper-outer quadrant
of right breast seen on the MLO view. No additional mass is seen in
the right breast. There are no malignant type microcalcifications in
the right breast. No suspicious mass or malignant type
microcalcifications identified in the left breast.

Mammographic images were processed with CAD.

On physical exam, no mass is palpated in the 1 o'clock region of the
right breast or the upper-outer quadrant of the right breast.

Targeted ultrasound is performed, showing normal dense
fibroglandular tissue in the area of clinical concern in the right
breast at 1 o'clock 7 cm from the nipple. There is a
well-circumscribed anechoic cyst in the right breast at 10 o'clock
10 cm from the nipple measuring 6 x 3 x 5 mm.
IMPRESSION: No evidence of malignancy in either breast.

RECOMMENDATION:
Bilateral screening mammogram in 1 year is recommended.

I have discussed the findings and recommendations with the patient.
Results were also provided in writing at the conclusion of the
visit. If applicable, a reminder letter will be sent to the patient
regarding the next appointment.

BI-RADS CATEGORY  2: Benign.

:
Addendum: Please disregard the ultrasound report dated 05/15/2018.
The correct report is on the mammogram study dated 05/15/2018
dictated as follows:
ACR Breast Density Category b: There are scattered areas of
fibroglandular density.
FINDINGS: There is a stable well-circumscribed mass in the medial aspect of
the left breast. No suspicious mass or malignant type
microcalcifications identified in either breast.

Mammographic images were processed with CAD.

On physical exam, no mass is palpated in the 9 o'clock region of the
left breast.

Targeted ultrasound is performed, showing a well-circumscribed
hypoechoic mass in the left breast at 9 o'clock 1 cm from the nipple
measuring 1.1 x 0.6 x 1.1 cm. On the prior ultrasound dated
05/13/2017 it measured 1.1 x 0.6 x 1.2 cm.
IMPRESSION: Stable probable fibroadenoma in the left breast.

RECOMMENDATION:
Bilateral diagnostic mammogram and left breast ultrasound in 1 year
is recommended.

I have discussed the findings and recommendations with the patient.
Results were also provided in writing at the conclusion of the
visit. If applicable, a reminder letter will be sent to the patient
regarding the next appointment.

BI-RADS CATEGORY  3: Probably benign.

*** End of Addendum ***
ACR Breast Density Category c: The breast tissue is heterogeneously
dense, which may obscure small masses.
FINDINGS: There is a well-circumscribed 6 mm mass in the upper-outer quadrant
of right breast seen on the MLO view. No additional mass is seen in
the right breast. There are no malignant type microcalcifications in
the right breast. No suspicious mass or malignant type
microcalcifications identified in the left breast.

Mammographic images were processed with CAD.

On physical exam, no mass is palpated in the 1 o'clock region of the
right breast or the upper-outer quadrant of the right breast.

Targeted ultrasound is performed, showing normal dense
fibroglandular tissue in the area of clinical concern in the right
breast at 1 o'clock 7 cm from the nipple. There is a
well-circumscribed anechoic cyst in the right breast at 10 o'clock
10 cm from the nipple measuring 6 x 3 x 5 mm.
IMPRESSION: No evidence of malignancy in either breast.

RECOMMENDATION:
Bilateral screening mammogram in 1 year is recommended.

I have discussed the findings and recommendations with the patient.
Results were also provided in writing at the conclusion of the
visit. If applicable, a reminder letter will be sent to the patient
regarding the next appointment.

BI-RADS CATEGORY  2: Benign.

## 2019-04-19 ENCOUNTER — Other Ambulatory Visit: Payer: Self-pay | Admitting: Internal Medicine

## 2019-04-19 ENCOUNTER — Encounter: Payer: Self-pay | Admitting: Internal Medicine

## 2019-04-19 ENCOUNTER — Other Ambulatory Visit: Payer: Self-pay

## 2019-04-19 ENCOUNTER — Ambulatory Visit (INDEPENDENT_AMBULATORY_CARE_PROVIDER_SITE_OTHER): Payer: 59 | Admitting: Internal Medicine

## 2019-04-19 VITALS — BP 124/76 | HR 78 | Ht 67.0 in | Wt 248.0 lb

## 2019-04-19 DIAGNOSIS — Z0000 Encounter for general adult medical examination without abnormal findings: Secondary | ICD-10-CM | POA: Diagnosis not present

## 2019-04-19 DIAGNOSIS — E1169 Type 2 diabetes mellitus with other specified complication: Secondary | ICD-10-CM | POA: Diagnosis not present

## 2019-04-19 DIAGNOSIS — Z6841 Body Mass Index (BMI) 40.0 and over, adult: Secondary | ICD-10-CM | POA: Insufficient documentation

## 2019-04-19 DIAGNOSIS — Z6838 Body mass index (BMI) 38.0-38.9, adult: Secondary | ICD-10-CM

## 2019-04-19 DIAGNOSIS — E785 Hyperlipidemia, unspecified: Secondary | ICD-10-CM

## 2019-04-19 DIAGNOSIS — E119 Type 2 diabetes mellitus without complications: Secondary | ICD-10-CM | POA: Diagnosis not present

## 2019-04-19 DIAGNOSIS — Z1231 Encounter for screening mammogram for malignant neoplasm of breast: Secondary | ICD-10-CM | POA: Diagnosis not present

## 2019-04-19 LAB — POCT URINALYSIS DIPSTICK
Bilirubin, UA: NEGATIVE
Blood, UA: NEGATIVE
Glucose, UA: NEGATIVE
Ketones, UA: NEGATIVE
Leukocytes, UA: NEGATIVE
Nitrite, UA: NEGATIVE
Protein, UA: NEGATIVE
Spec Grav, UA: 1.015 (ref 1.010–1.025)
Urobilinogen, UA: 0.2 E.U./dL
pH, UA: 6 (ref 5.0–8.0)

## 2019-04-19 MED ORDER — ACCU-CHEK FASTCLIX LANCETS MISC: 1.0000 | Freq: Two times a day (BID) | 3 refills | Status: DC

## 2019-04-19 MED ORDER — ACCU-CHEK GUIDE VI STRP: ORAL_STRIP | 12 refills | Status: DC

## 2019-04-19 NOTE — Patient Instructions (Signed)
Schedule DM eye exam Health Maintenance, Female Adopting a healthy lifestyle and getting preventive care are important in promoting health and wellness. Ask your health care provider about:  The right schedule for you to have regular tests and exams.  Things you can do on your own to prevent diseases and keep yourself healthy. What should I know about diet, weight, and exercise? Eat a healthy diet   Eat a diet that includes plenty of vegetables, fruits, low-fat dairy products, and lean protein.  Do not eat a lot of foods that are high in solid fats, added sugars, or sodium. Maintain a healthy weight Body mass index (BMI) is used to identify weight problems. It estimates body fat based on height and weight. Your health care provider can help determine your BMI and help you achieve or maintain a healthy weight. Get regular exercise Get regular exercise. This is one of the most important things you can do for your health. Most adults should:  Exercise for at least 150 minutes each week. The exercise should increase your heart rate and make you sweat (moderate-intensity exercise).  Do strengthening exercises at least twice a week. This is in addition to the moderate-intensity exercise.  Spend less time sitting. Even light physical activity can be beneficial. Watch cholesterol and blood lipids Have your blood tested for lipids and cholesterol at 57 years of age, then have this test every 5 years. Have your cholesterol levels checked more often if:  Your lipid or cholesterol levels are high.  You are older than 57 years of age.  You are at high risk for heart disease. What should I know about cancer screening? Depending on your health history and family history, you may need to have cancer screening at various ages. This may include screening for:  Breast cancer.  Cervical cancer.  Colorectal cancer.  Skin cancer.  Lung cancer. What should I know about heart disease, diabetes,  and high blood pressure? Blood pressure and heart disease  High blood pressure causes heart disease and increases the risk of stroke. This is more likely to develop in people who have high blood pressure readings, are of African descent, or are overweight.  Have your blood pressure checked: ? Every 3-5 years if you are 9318-57 years of age. ? Every year if you are 57 years old or older. Diabetes Have regular diabetes screenings. This checks your fasting blood sugar level. Have the screening done:  Once every three years after age 10940 if you are at a normal weight and have a low risk for diabetes.  More often and at a younger age if you are overweight or have a high risk for diabetes. What should I know about preventing infection? Hepatitis B If you have a higher risk for hepatitis B, you should be screened for this virus. Talk with your health care provider to find out if you are at risk for hepatitis B infection. Hepatitis C Testing is recommended for:  Everyone born from [birth year] through 1965.  Anyone with known risk factors for hepatitis C. Sexually transmitted infections (STIs)  Get screened for STIs, including gonorrhea and chlamydia, if: ? You are sexually active and are younger than 57 years of age. ? You are older than 57 years of age and your health care provider tells you that you are at risk for this type of infection. ? Your sexual activity has changed since you were last screened, and you are at increased risk for chlamydia or gonorrhea. Ask your  health care provider if you are at risk.  Ask your health care provider about whether you are at high risk for HIV. Your health care provider may recommend a prescription medicine to help prevent HIV infection. If you choose to take medicine to prevent HIV, you should first get tested for HIV. You should then be tested every 3 months for as long as you are taking the medicine. Pregnancy  If you are about to stop having your period  (premenopausal) and you may become pregnant, seek counseling before you get pregnant.  Take 400 to 800 micrograms (mcg) of folic acid every day if you become pregnant.  Ask for birth control (contraception) if you want to prevent pregnancy. Osteoporosis and menopause Osteoporosis is a disease in which the bones lose minerals and strength with aging. This can result in bone fractures. If you are 7 years old or older, or if you are at risk for osteoporosis and fractures, ask your health care provider if you should:  Be screened for bone loss.  Take a calcium or vitamin D supplement to lower your risk of fractures.  Be given hormone replacement therapy (HRT) to treat symptoms of menopause. Follow these instructions at home: Lifestyle  Do not use any products that contain nicotine or tobacco, such as cigarettes, e-cigarettes, and chewing tobacco. If you need help quitting, ask your health care provider.  Do not use street drugs.  Do not share needles.  Ask your health care provider for help if you need support or information about quitting drugs. Alcohol use  Do not drink alcohol if: ? Your health care provider tells you not to drink. ? You are pregnant, may be pregnant, or are planning to become pregnant.  If you drink alcohol: ? Limit how much you use to 0-1 drink a day. ? Limit intake if you are breastfeeding.  Be aware of how much alcohol is in your drink. In the [place] one drink equals one 12 oz bottle of beer (355 mL), one 5 oz glass of wine (148 mL), or one 1 oz glass of hard liquor (44 mL). General instructions  Schedule regular health, dental, and eye exams.  Stay current with your vaccines.  Tell your health care provider if: ? You often feel depressed. ? You have ever been abused or do not feel safe at home. Summary  Adopting a healthy lifestyle and getting preventive care are important in promoting health and wellness.  Follow your health care provider's  instructions about healthy diet, exercising, and getting tested or screened for diseases.  Follow your health care provider's instructions on monitoring your cholesterol and blood pressure. This information is not intended to replace advice given to you by your health care provider. Make sure you discuss any questions you have with your health care provider. Document Released: 03/08/2011 Document Revised: 08/16/2018 Document Reviewed: 08/16/2018 Elsevier Patient Education  2020 [employer].

## 2019-04-19 NOTE — Progress Notes (Signed)
Date:  04/19/2019   Name:  Karla Chapman   DOB:  05-23-1961   MRN:  [id number]   Chief Complaint: Annual Exam (Breast Exam.) and Diabetes (Microalbumin.) Karla Chapman is a 58 y.o. female who presents today for her Complete Annual Exam. She feels well. She reports exercising regularly and has lost 20 lbs. She reports she is sleeping well. She denies breast issues and is due for mammogram.  Cologuard 08/2017 Pap  04/2018 Mammogram  05/2018 PPV-23 2019  Diabetes She presents for her follow-up diabetic visit. She has type 2 diabetes mellitus. Her disease course has been stable. Pertinent negatives for hypoglycemia include no dizziness, headaches, nervousness/anxiousness or tremors. Pertinent negatives for diabetes include no chest pain, no fatigue, no polydipsia and no polyuria. Current diabetic treatment includes diet. She is compliant with treatment most of the time. Her weight is decreasing steadily. She participates in exercise daily. She monitors blood glucose at home 1-2 x per day. Her breakfast blood glucose is taken between 6-7 am. Her breakfast blood glucose range is generally 90-110 mg/dl. An ACE inhibitor/angiotensin II receptor blocker is not being taken. Eye exam is not current.  Hyperlipidemia This is a chronic problem. The problem is uncontrolled. Pertinent negatives include no chest pain or shortness of breath. Current antihyperlipidemic treatment includes diet change and exercise.   Lab Results  Component Value Date   HGBA1C 7.4 (H) 10/20/2018   Lab Results  Component Value Date   CHOL 208 (H) 04/19/2018   HDL 47 04/19/2018   LDLCALC 139 (H) 04/19/2018   TRIG 111 04/19/2018   CHOLHDL 4.4 04/19/2018     Review of Systems  Constitutional: Positive for unexpected weight change (almost 20 lbs with effort). Negative for chills, fatigue and fever.  HENT: Negative for congestion, hearing loss, tinnitus, trouble swallowing and voice change.   Eyes: Negative for  visual disturbance.  Respiratory: Negative for cough, chest tightness, shortness of breath and wheezing.   Cardiovascular: Negative for chest pain, palpitations and leg swelling.  Gastrointestinal: Negative for abdominal pain, constipation, diarrhea and vomiting.  Endocrine: Negative for polydipsia and polyuria.  Genitourinary: Negative for dysuria, frequency, genital sores, vaginal bleeding and vaginal discharge.  Musculoskeletal: Negative for arthralgias, gait problem and joint swelling.  Skin: Negative for color change and rash.  Allergic/Immunologic: Negative for environmental allergies.  Neurological: Negative for dizziness, tremors, light-headedness and headaches.  Hematological: Negative for adenopathy. Does not bruise/bleed easily.  Psychiatric/Behavioral: Negative for dysphoric mood and sleep disturbance. The patient is not nervous/anxious.     Patient Active Problem List   Diagnosis Date Noted  . Hyperlipidemia associated with type 2 diabetes mellitus ([place]) 04/19/2019  . BMI 40.0-44.9, adult ([place]) 04/19/2019  . Type II diabetes mellitus with complication ([place]) 96/29/5284    Allergies  Allergen Reactions  . Penicillins Rash    History reviewed. No pertinent surgical history.  Social History   Tobacco Use  . Smoking status: Never Smoker  . Smokeless tobacco: Never Used  Substance Use Topics  . Alcohol use: Yes    Comment: 1-2 glasses of beer, or wine weekly   . Drug use: No     Medication list has been reviewed and updated.  Current Meds  Medication Sig  . ACCU-CHEK FASTCLIX LANCETS MISC 1 each by Does not apply route 2 (two) times daily.  . Garlic (GARLIQUE PO) Take by mouth.  Marland Kitchen glucose blood (ACCU-CHEK GUIDE) test strip Test BS twice a day  . Omega-3 Fatty Acids (  FISH OIL) 1000 MG CPDR Take by mouth.    PHQ 2/9 Scores 04/19/2019 10/20/2018 04/18/2018 08/19/2017  PHQ - 2 Score 0 0 0 0    BP Readings from Last 3 Encounters:  04/19/19 124/76  10/20/18  128/72  04/18/18 118/74    Physical Exam Vitals signs and nursing note reviewed.  Constitutional:      General: She is not in acute distress.    Appearance: She is well-developed.  HENT:     Head: Normocephalic and atraumatic.     Right Ear: Tympanic membrane and ear canal normal.     Left Ear: Tympanic membrane and ear canal normal.     Nose:     Right Sinus: No maxillary sinus tenderness.     Left Sinus: No maxillary sinus tenderness.     Mouth/Throat:     Pharynx: Uvula midline.  Eyes:     General: No scleral icterus.       Right eye: No discharge.        Left eye: No discharge.     Conjunctiva/sclera: Conjunctivae normal.  Neck:     Musculoskeletal: Normal range of motion. No erythema.     Thyroid: No thyromegaly.     Vascular: No carotid bruit.  Cardiovascular:     Rate and Rhythm: Normal rate and regular rhythm.     Pulses: Normal pulses.     Heart sounds: Normal heart sounds.  Pulmonary:     Effort: Pulmonary effort is normal. No respiratory distress.     Breath sounds: No wheezing.  Chest:     Breasts:        Right: No mass, nipple discharge, skin change or tenderness.        Left: No mass, nipple discharge, skin change or tenderness.  Abdominal:     General: Bowel sounds are normal.     Palpations: Abdomen is soft.     Tenderness: There is no abdominal tenderness.  Musculoskeletal: Normal range of motion.  Lymphadenopathy:     Cervical: No cervical adenopathy.  Skin:    General: Skin is warm and dry.     Findings: No rash.  Neurological:     Mental Status: She is alert and oriented to person, place, and time.     Cranial Nerves: No cranial nerve deficit.     Sensory: No sensory deficit.     Deep Tendon Reflexes: Reflexes are normal and symmetric.  Psychiatric:        Speech: Speech normal.        Behavior: Behavior normal.        Thought Content: Thought content normal.     Wt Readings from Last 3 Encounters:  04/19/19 248 lb (112.5 kg)  10/20/18  266 lb 4.8 oz (120.8 kg)  04/18/18 257 lb (116.6 kg)    BP 124/76   Pulse 78   Ht 5\' 7"  (1.702 m)   Wt 248 lb (112.5 kg)   SpO2 97%   BMI 38.84 kg/m   Assessment and Plan: 1. Annual physical exam Normal exam except for weight Continue diet and exercise for ongoing weight loss - pt congratulated on her success  2. Encounter for screening mammogram for breast cancer [facility] MM 3D SCREEN BREAST BILATERAL; Future  3. Controlled type 2 diabetes mellitus without complication, without long-term current use of insulin (HCC) Exam and BS stable, should be improving with weight loss and exercise No worrisome findings; foot exam normal Needs to schedule eye exam -  CBC with Differential/Platelet - Comprehensive metabolic panel - Hemoglobin A1c - TSH - Microalbumin / creatinine urine ratio - POCT urinalysis dipstick - glucose blood (ACCU-CHEK GUIDE) test strip; Test BS twice a day  Dispense: 100 each; Refill: 12 - Accu-Chek FastClix Lancets MISC; 1 each by Does not apply route 2 (two) times daily.  Dispense: 100 each; Refill: 3  4. Hyperlipidemia associated with type 2 diabetes mellitus (HCC) Pt has declined medication in the past - will continue dietary changes and weight loss Will advise if still not at goal of <70 LDL - Lipid panel  5. BMI 38.0-38.9,adult Decreasing steadily with diet and exercise   Partially dictated using Animal nutritionistDragon software. Any errors are unintentional.  Bari EdwardLaura Noa Galvao, MD [facility]  04/19/2019

## 2019-04-20 LAB — COMPREHENSIVE METABOLIC PANEL
ALT: 27 IU/L (ref 0–32)
AST: 28 IU/L (ref 0–40)
Albumin/Globulin Ratio: 1.7 (ref 1.2–2.2)
Albumin: 4.6 g/dL (ref 3.8–4.9)
Alkaline Phosphatase: 84 IU/L (ref 39–117)
BUN/Creatinine Ratio: 14 (ref 9–23)
BUN: 11 mg/dL (ref 6–24)
Bilirubin Total: 0.4 mg/dL (ref 0.0–1.2)
CO2: 22 mmol/L (ref 20–29)
Calcium: 9.3 mg/dL (ref 8.7–10.2)
Chloride: 107 mmol/L — ABNORMAL HIGH (ref 96–106)
Creatinine, Ser: 0.8 mg/dL (ref 0.57–1.00)
GFR calc Af Amer: 95 mL/min/{1.73_m2} (ref >59)
GFR calc non Af Amer: 82 mL/min/{1.73_m2} (ref >59)
Globulin, Total: 2.7 g/dL (ref 1.5–4.5)
Glucose: 145 mg/dL — ABNORMAL HIGH (ref 65–99)
Potassium: 4.6 mmol/L (ref 3.5–5.2)
Sodium: 143 mmol/L (ref 134–144)
Total Protein: 7.3 g/dL (ref 6.0–8.5)

## 2019-04-20 LAB — CBC WITH DIFFERENTIAL/PLATELET
Basophils Absolute: 0 10*3/uL (ref 0.0–0.2)
Basos: 1 %
EOS (ABSOLUTE): 0.3 10*3/uL (ref 0.0–0.4)
Eos: 5 %
Hematocrit: 37.6 % (ref 34.0–46.6)
Hemoglobin: 12.1 g/dL (ref 11.1–15.9)
Immature Grans (Abs): 0 10*3/uL (ref 0.0–0.1)
Immature Granulocytes: 0 %
Lymphocytes Absolute: 2 10*3/uL (ref 0.7–3.1)
Lymphs: 34 %
MCH: 27.1 pg (ref 26.6–33.0)
MCHC: 32.2 g/dL (ref 31.5–35.7)
MCV: 84 fL (ref 79–97)
Monocytes Absolute: 0.5 10*3/uL (ref 0.1–0.9)
Monocytes: 10 %
Neutrophils Absolute: 2.9 10*3/uL (ref 1.4–7.0)
Neutrophils: 50 %
Platelets: 292 10*3/uL (ref 150–450)
RBC: 4.47 x10E6/uL (ref 3.77–5.28)
RDW: 16.3 % — ABNORMAL HIGH (ref 11.7–15.4)
WBC: 5.7 10*3/uL (ref 3.4–10.8)

## 2019-04-20 LAB — TSH: TSH: 1.29 u[IU]/mL (ref 0.450–4.500)

## 2019-04-20 LAB — HEMOGLOBIN A1C
Est. average glucose Bld gHb Est-mCnc: 148 mg/dL
Hgb A1c MFr Bld: 6.8 % — ABNORMAL HIGH (ref 4.8–5.6)

## 2019-04-20 LAB — LIPID PANEL
Chol/HDL Ratio: 4.3 ratio (ref 0.0–4.4)
Cholesterol, Total: 234 mg/dL — ABNORMAL HIGH (ref 100–199)
HDL: 54 mg/dL (ref >39)
LDL Calculated: 158 mg/dL — ABNORMAL HIGH (ref 0–99)
Triglycerides: 109 mg/dL (ref 0–149)
VLDL Cholesterol Cal: 22 mg/dL (ref 5–40)

## 2019-04-23 LAB — MICROALBUMIN / CREATININE URINE RATIO

## 2019-05-25 ENCOUNTER — Other Ambulatory Visit: Payer: Self-pay | Admitting: Internal Medicine

## 2019-05-25 ENCOUNTER — Encounter: Payer: Self-pay | Admitting: Internal Medicine

## 2019-05-25 DIAGNOSIS — Z1231 Encounter for screening mammogram for malignant neoplasm of breast: Secondary | ICD-10-CM

## 2019-06-13 ENCOUNTER — Other Ambulatory Visit: Payer: Self-pay | Admitting: Internal Medicine

## 2019-06-13 ENCOUNTER — Encounter: Payer: Self-pay | Admitting: Internal Medicine

## 2019-06-13 DIAGNOSIS — R928 Other abnormal and inconclusive findings on diagnostic imaging of breast: Secondary | ICD-10-CM

## 2019-06-13 DIAGNOSIS — Z1231 Encounter for screening mammogram for malignant neoplasm of breast: Secondary | ICD-10-CM

## 2019-06-20 ENCOUNTER — Other Ambulatory Visit: Payer: Self-pay | Admitting: Internal Medicine

## 2019-06-20 DIAGNOSIS — N63 Unspecified lump in unspecified breast: Secondary | ICD-10-CM

## 2019-06-22 ENCOUNTER — Other Ambulatory Visit: Payer: Self-pay

## 2019-06-22 ENCOUNTER — Ambulatory Visit
Admission: RE | Admit: 2019-06-22 | Discharge: 2019-06-22 | Disposition: A | Discharge status: Home / Self Care | Payer: 59 | Source: Ambulatory Visit | Attending: Internal Medicine | Admitting: Internal Medicine

## 2019-06-22 DIAGNOSIS — N63 Unspecified lump in unspecified breast: Secondary | ICD-10-CM

## 2019-06-22 DIAGNOSIS — N6324 Unspecified lump in the left breast, lower inner quadrant: Secondary | ICD-10-CM | POA: Diagnosis not present

## 2019-06-22 DIAGNOSIS — N6322 Unspecified lump in the left breast, upper inner quadrant: Secondary | ICD-10-CM | POA: Diagnosis not present

## 2019-06-22 DIAGNOSIS — R928 Other abnormal and inconclusive findings on diagnostic imaging of breast: Secondary | ICD-10-CM | POA: Diagnosis not present

## 2019-07-06 ENCOUNTER — Other Ambulatory Visit: Payer: 59

## 2019-07-13 DIAGNOSIS — H5213 Myopia, bilateral: Secondary | ICD-10-CM | POA: Diagnosis not present

## 2019-07-13 LAB — HM DIABETES EYE EXAM

## 2019-10-25 ENCOUNTER — Ambulatory Visit: Payer: 59 | Admitting: Internal Medicine

## 2019-10-29 ENCOUNTER — Ambulatory Visit: Payer: 59 | Admitting: Internal Medicine

## 2019-11-21 LAB — RESULTS CONSOLE HPV: CHL HPV: NEGATIVE

## 2019-11-21 LAB — HM PAP SMEAR

## 2020-04-20 ENCOUNTER — Encounter: Payer: Self-pay | Admitting: Internal Medicine

## 2020-04-20 NOTE — Progress Notes (Signed)
Date:  04/21/2020   Name:  Karla Chapman   DOB:  [birth year]   MRN:  [id number]   Chief Complaint: Annual Exam (breast exam/ no pap)  Karla Chapman is a 59 y.o. female who presents today for her Complete Annual Exam. She feels well. She reports exercising workout videos 5-6 times a week for 30 mins, Walking 3-4 days a week. She reports she is sleeping fairly well. Breast complaints none.  Mammogram: 06/2019 @ [facility] DEXA: not due Pap smear: 04/2018 neg thin prep Colonoscopy: Cologuard 08/2017 due for repeat Eye exam: 06/2019 Foot exam due  Immunization History  Administered Date(s) Administered  . Influenza-Unspecified 06/19/2017, 06/26/2018, 06/13/2019  . PFIZER SARS-COV-2 Vaccination 09/24/2019, 10/15/2019  . Pneumococcal Polysaccharide-23 04/18/2018  . Zoster Recombinat (Shingrix) 04/21/2020    Diabetes She presents for her follow-up diabetic visit. She has type 2 diabetes mellitus. Her disease course has been stable. Pertinent negatives for hypoglycemia include no dizziness, headaches, nervousness/anxiousness or tremors. Pertinent negatives for diabetes include no chest pain, no fatigue, no polydipsia and no polyuria. Current diabetic treatment includes diet. She is compliant with treatment most of the time. Her weight is stable. She is following a generally healthy diet. She monitors blood glucose at home 1-2 x per week. Her breakfast blood glucose is taken between 6-7 am. Her breakfast blood glucose range is generally 130-140 mg/dl. An ACE inhibitor/angiotensin II receptor blocker is not being taken. Eye exam is current.    Lab Results  Component Value Date   CREATININE 0.80 04/19/2019   BUN 11 04/19/2019   NA 143 04/19/2019   K 4.6 04/19/2019   CL 107 (H) 04/19/2019   CO2 22 04/19/2019   Lab Results  Component Value Date   CHOL 234 (H) 04/19/2019   HDL 54 04/19/2019   LDLCALC 158 (H) 04/19/2019   TRIG 109 04/19/2019   CHOLHDL 4.3 04/19/2019   Lab Results    Component Value Date   TSH 1.290 04/19/2019   Lab Results  Component Value Date   HGBA1C 6.8 (H) 04/19/2019   Lab Results  Component Value Date   WBC 5.7 04/19/2019   HGB 12.1 04/19/2019   HCT 37.6 04/19/2019   MCV 84 04/19/2019   PLT 292 04/19/2019   Lab Results  Component Value Date   ALT 27 04/19/2019   AST 28 04/19/2019   ALKPHOS 84 04/19/2019   BILITOT 0.4 04/19/2019     Review of Systems  Constitutional: Negative for chills, fatigue and fever.  HENT: Positive for tinnitus (both ears ). Negative for congestion, hearing loss, trouble swallowing and voice change.   Eyes: Negative for visual disturbance.  Respiratory: Negative for cough, chest tightness, shortness of breath and wheezing.   Cardiovascular: Negative for chest pain, palpitations and leg swelling.  Gastrointestinal: Negative for abdominal pain, constipation, diarrhea and vomiting.  Endocrine: Negative for polydipsia and polyuria.  Genitourinary: Negative for dysuria, frequency, genital sores, vaginal bleeding and vaginal discharge.  Musculoskeletal: Positive for back pain (lower back pain occassionally). Negative for arthralgias, gait problem and joint swelling.  Skin: Negative for color change and rash.  Neurological: Negative for dizziness, tremors, light-headedness and headaches.  Hematological: Negative for adenopathy. Does not bruise/bleed easily.  Psychiatric/Behavioral: Negative for dysphoric mood and sleep disturbance. The patient is not nervous/anxious.     Patient Active Problem List   Diagnosis Date Noted  . Hyperlipidemia associated with type 2 diabetes mellitus (HCC) 04/19/2019  . BMI 40.0-44.9, adult (HCC) 04/19/2019  .  Type II diabetes mellitus with complication (HCC) 08/19/2017    Allergies  Allergen Reactions  . Penicillins Rash    History reviewed. No pertinent surgical history.  Social History   Tobacco Use  . Smoking status: Never Smoker  . Smokeless tobacco: Never Used   Vaping Use  . Vaping Use: Never used  Substance Use Topics  . Alcohol use: Yes    Comment: 1-2 glasses of beer, or wine weekly   . Drug use: No     Medication list has been reviewed and updated.  Current Meds  Medication Sig  . Accu-Chek FastClix Lancets MISC 1 each by Does not apply route 2 (two) times daily.  . Garlic (GARLIQUE PO) Take by mouth.  Marland Kitchen glucose blood (ACCU-CHEK GUIDE) test strip Test BS twice a day  . Omega-3 Fatty Acids (FISH OIL) 1000 MG CPDR Take by mouth.    PHQ 2/9 Scores 04/21/2020 04/19/2019 10/20/2018 04/18/2018  PHQ - 2 Score 0 0 0 0  PHQ- 9 Score 0 - - -    GAD 7 : Generalized Anxiety Score 04/21/2020  Nervous, Anxious, on Edge 0  Control/stop worrying 0  Worry too much - different things 0  Trouble relaxing 0  Restless 0  Easily annoyed or irritable 0  Afraid - awful might happen 0  Total GAD 7 Score 0  Anxiety Difficulty Not difficult at all    BP Readings from Last 3 Encounters:  04/21/20 126/84  04/19/19 124/76  10/20/18 128/72    Physical Exam Vitals and nursing note reviewed.  Constitutional:      General: She is not in acute distress.    Appearance: She is well-developed.  HENT:     Head: Normocephalic and atraumatic.     Right Ear: Tympanic membrane and ear canal normal.     Left Ear: Tympanic membrane and ear canal normal.     Nose:     Right Sinus: No maxillary sinus tenderness.     Left Sinus: No maxillary sinus tenderness.  Eyes:     General: No scleral icterus.       Right eye: No discharge.        Left eye: No discharge.     Conjunctiva/sclera: Conjunctivae normal.  Neck:     Thyroid: No thyromegaly.     Vascular: No carotid bruit.  Cardiovascular:     Rate and Rhythm: Normal rate and regular rhythm.     Pulses: Normal pulses.     Heart sounds: Normal heart sounds.  Pulmonary:     Effort: Pulmonary effort is normal. No respiratory distress.     Breath sounds: No wheezing.  Chest:     Breasts:        Right: No  mass, nipple discharge, skin change or tenderness.        Left: No mass, nipple discharge, skin change or tenderness.  Abdominal:     General: Bowel sounds are normal.     Palpations: Abdomen is soft.     Tenderness: There is no abdominal tenderness.  Musculoskeletal:     Cervical back: Normal range of motion. No erythema.     Right lower leg: No edema.     Left lower leg: No edema.  Lymphadenopathy:     Cervical: No cervical adenopathy.  Skin:    General: Skin is warm and dry.     Findings: No rash.  Neurological:     Mental Status: She is alert and oriented to person, place, and  time.     Cranial Nerves: No cranial nerve deficit.     Sensory: No sensory deficit.     Deep Tendon Reflexes: Reflexes are normal and symmetric.  Psychiatric:        Attention and Perception: Attention normal.        Mood and Affect: Mood normal.     Wt Readings from Last 3 Encounters:  04/21/20 253 lb (114.8 kg)  04/19/19 248 lb (112.5 kg)  10/20/18 266 lb 4.8 oz (120.8 kg)    BP 126/84   Pulse 79   Temp 97.6 F (36.4 C) (Oral)   Ht 5\' 7"  (1.702 m)   Wt 253 lb (114.8 kg)   SpO2 97%   BMI 39.63 kg/m   Assessment and Plan: 1. Annual physical exam Normal exam except for weight Continue efforts with exercise and diet changed - CBC with Differential/Platelet - POCT urinalysis dipstick  2. Encounter for screening mammogram for breast cancer Pt to schedule at [facility] - MM 3D SCREEN BREAST BILATERAL; Future  3. Colon cancer screening - Cologuard  4. Type II diabetes mellitus with complication (HCC) Clinically stable by exam and report without s/s of hypoglycemia. Continue monitoring and Diabetic/gluten free diet - Comprehensive metabolic panel - Hemoglobin A1c - Lipid panel - Microalbumin / creatinine urine ratio - TSH  5. Hyperlipidemia associated with type 2 diabetes mellitus (HCC) - Lipid panel  6. Need for shingles vaccine First dose today - Varicella-zoster vaccine  IM   Partially dictated using . Any errors are unintentional.  Animal nutritionist, MD [facility]  04/21/2020

## 2020-04-21 ENCOUNTER — Ambulatory Visit (INDEPENDENT_AMBULATORY_CARE_PROVIDER_SITE_OTHER): Payer: 59 | Admitting: Internal Medicine

## 2020-04-21 ENCOUNTER — Other Ambulatory Visit: Payer: Self-pay

## 2020-04-21 ENCOUNTER — Other Ambulatory Visit: Payer: Self-pay | Admitting: Internal Medicine

## 2020-04-21 ENCOUNTER — Encounter: Payer: Self-pay | Admitting: Internal Medicine

## 2020-04-21 VITALS — BP 126/84 | HR 79 | Temp 97.6°F | Ht 67.0 in | Wt 253.0 lb

## 2020-04-21 DIAGNOSIS — E118 Type 2 diabetes mellitus with unspecified complications: Secondary | ICD-10-CM | POA: Diagnosis not present

## 2020-04-21 DIAGNOSIS — E1169 Type 2 diabetes mellitus with other specified complication: Secondary | ICD-10-CM

## 2020-04-21 DIAGNOSIS — Z1231 Encounter for screening mammogram for malignant neoplasm of breast: Secondary | ICD-10-CM | POA: Diagnosis not present

## 2020-04-21 DIAGNOSIS — E785 Hyperlipidemia, unspecified: Secondary | ICD-10-CM

## 2020-04-21 DIAGNOSIS — Z1211 Encounter for screening for malignant neoplasm of colon: Secondary | ICD-10-CM | POA: Diagnosis not present

## 2020-04-21 DIAGNOSIS — Z6839 Body mass index (BMI) 39.0-39.9, adult: Secondary | ICD-10-CM

## 2020-04-21 DIAGNOSIS — Z0000 Encounter for general adult medical examination without abnormal findings: Secondary | ICD-10-CM | POA: Diagnosis not present

## 2020-04-21 DIAGNOSIS — Z23 Encounter for immunization: Secondary | ICD-10-CM

## 2020-04-21 LAB — POCT URINALYSIS DIPSTICK
Bilirubin, UA: NEGATIVE
Blood, UA: NEGATIVE
Glucose, UA: NEGATIVE
Ketones, UA: NEGATIVE
Leukocytes, UA: NEGATIVE
Nitrite, UA: NEGATIVE
Protein, UA: NEGATIVE
Spec Grav, UA: 1.02 (ref 1.010–1.025)
Urobilinogen, UA: 0.2 E.U./dL
pH, UA: 5 (ref 5.0–8.0)

## 2020-04-23 LAB — MICROALBUMIN / CREATININE URINE RATIO
Creatinine, Urine: 239.2 mg/dL
Microalb/Creat Ratio: 13 mg/g creat (ref 0–29)
Microalbumin, Urine: 29.9 ug/mL

## 2020-04-23 LAB — SPECIMEN STATUS REPORT

## 2020-04-30 ENCOUNTER — Telehealth: Payer: Self-pay

## 2020-04-30 NOTE — Telephone Encounter (Signed)
Called and spoke with patient to remind her to have her lab work drawn from her recent appt.  She said she went to [facility] the day of her appt and they were very busy and it was a long wait. She said she has the paperwork still and will go get her labs drawn.  CM

## 2020-05-06 DIAGNOSIS — Z1211 Encounter for screening for malignant neoplasm of colon: Secondary | ICD-10-CM | POA: Diagnosis not present

## 2020-05-06 LAB — COLOGUARD: Cologuard: NEGATIVE

## 2020-05-15 ENCOUNTER — Telehealth: Payer: Self-pay | Admitting: Internal Medicine

## 2020-05-15 NOTE — Telephone Encounter (Signed)
Called pt left VM that cologaurd was negative. Will repeat in 3 years. Pt name was stated on VM.  KP

## 2020-05-15 NOTE — Telephone Encounter (Signed)
Cologuard is negative,  Repeat in 3 years for screening purposes.

## 2020-06-23 ENCOUNTER — Other Ambulatory Visit: Payer: Self-pay

## 2020-06-23 ENCOUNTER — Ambulatory Visit
Admission: RE | Admit: 2020-06-23 | Discharge: 2020-06-23 | Disposition: A | Discharge status: Home / Self Care | Payer: 59 | Source: Ambulatory Visit | Attending: Internal Medicine | Admitting: Internal Medicine

## 2020-06-23 DIAGNOSIS — Z1231 Encounter for screening mammogram for malignant neoplasm of breast: Secondary | ICD-10-CM | POA: Diagnosis not present

## 2020-07-22 ENCOUNTER — Ambulatory Visit (INDEPENDENT_AMBULATORY_CARE_PROVIDER_SITE_OTHER): Payer: 59

## 2020-07-22 ENCOUNTER — Other Ambulatory Visit: Payer: Self-pay

## 2020-07-22 DIAGNOSIS — E1169 Type 2 diabetes mellitus with other specified complication: Secondary | ICD-10-CM | POA: Diagnosis not present

## 2020-07-22 DIAGNOSIS — Z23 Encounter for immunization: Secondary | ICD-10-CM | POA: Diagnosis not present

## 2020-07-22 DIAGNOSIS — E118 Type 2 diabetes mellitus with unspecified complications: Secondary | ICD-10-CM | POA: Diagnosis not present

## 2020-07-22 DIAGNOSIS — Z0000 Encounter for general adult medical examination without abnormal findings: Secondary | ICD-10-CM | POA: Diagnosis not present

## 2020-07-22 DIAGNOSIS — E785 Hyperlipidemia, unspecified: Secondary | ICD-10-CM | POA: Diagnosis not present

## 2020-07-23 LAB — CBC WITH DIFFERENTIAL/PLATELET
Basophils Absolute: 0.1 10*3/uL (ref 0.0–0.2)
Basos: 1 %
EOS (ABSOLUTE): 0.3 10*3/uL (ref 0.0–0.4)
Eos: 4 %
Hematocrit: 40.5 % (ref 34.0–46.6)
Hemoglobin: 13.1 g/dL (ref 11.1–15.9)
Immature Grans (Abs): 0 10*3/uL (ref 0.0–0.1)
Immature Granulocytes: 0 %
Lymphocytes Absolute: 2.1 10*3/uL (ref 0.7–3.1)
Lymphs: 35 %
MCH: 26.3 pg — ABNORMAL LOW (ref 26.6–33.0)
MCHC: 32.3 g/dL (ref 31.5–35.7)
MCV: 81 fL (ref 79–97)
Monocytes Absolute: 0.6 10*3/uL (ref 0.1–0.9)
Monocytes: 10 %
Neutrophils Absolute: 3 10*3/uL (ref 1.4–7.0)
Neutrophils: 50 %
Platelets: 303 10*3/uL (ref 150–450)
RBC: 4.99 x10E6/uL (ref 3.77–5.28)
RDW: 13.9 % (ref 11.7–15.4)
WBC: 6.1 10*3/uL (ref 3.4–10.8)

## 2020-07-23 LAB — COMPREHENSIVE METABOLIC PANEL
ALT: 50 IU/L — ABNORMAL HIGH (ref 0–32)
AST: 40 IU/L (ref 0–40)
Albumin/Globulin Ratio: 1.5 (ref 1.2–2.2)
Albumin: 4.5 g/dL (ref 3.8–4.9)
Alkaline Phosphatase: 100 IU/L (ref 44–121)
BUN/Creatinine Ratio: 10 (ref 9–23)
BUN: 9 mg/dL (ref 6–24)
Bilirubin Total: 0.5 mg/dL (ref 0.0–1.2)
CO2: 21 mmol/L (ref 20–29)
Calcium: 9.6 mg/dL (ref 8.7–10.2)
Chloride: 103 mmol/L (ref 96–106)
Creatinine, Ser: 0.91 mg/dL (ref 0.57–1.00)
GFR calc Af Amer: 80 mL/min/{1.73_m2} (ref >59)
GFR calc non Af Amer: 70 mL/min/{1.73_m2} (ref >59)
Globulin, Total: 3 g/dL (ref 1.5–4.5)
Glucose: 141 mg/dL — ABNORMAL HIGH (ref 65–99)
Potassium: 4.9 mmol/L (ref 3.5–5.2)
Sodium: 139 mmol/L (ref 134–144)
Total Protein: 7.5 g/dL (ref 6.0–8.5)

## 2020-07-23 LAB — LIPID PANEL
Chol/HDL Ratio: 4.3 ratio (ref 0.0–4.4)
Cholesterol, Total: 213 mg/dL — ABNORMAL HIGH (ref 100–199)
HDL: 49 mg/dL (ref >39)
LDL Chol Calc (NIH): 147 mg/dL — ABNORMAL HIGH (ref 0–99)
Triglycerides: 95 mg/dL (ref 0–149)
VLDL Cholesterol Cal: 17 mg/dL (ref 5–40)

## 2020-07-23 LAB — HEMOGLOBIN A1C
Est. average glucose Bld gHb Est-mCnc: 163 mg/dL
Hgb A1c MFr Bld: 7.3 % — ABNORMAL HIGH (ref 4.8–5.6)

## 2020-07-23 LAB — TSH: TSH: 1.08 u[IU]/mL (ref 0.450–4.500)

## 2020-07-24 ENCOUNTER — Other Ambulatory Visit: Payer: Self-pay | Admitting: Internal Medicine

## 2020-07-24 DIAGNOSIS — E118 Type 2 diabetes mellitus with unspecified complications: Secondary | ICD-10-CM

## 2020-07-24 MED ORDER — METFORMIN HCL ER 500 MG PO TB24: 500.0000 mg | ORAL_TABLET | Freq: Every day | ORAL | 0 refills | Status: DC

## 2020-10-24 ENCOUNTER — Ambulatory Visit: Payer: 59 | Admitting: Internal Medicine

## 2020-11-25 ENCOUNTER — Other Ambulatory Visit: Payer: Self-pay

## 2020-11-25 NOTE — Patient Instructions (Signed)
Health Maintenance Due  Topic Date Due  . COVID-19 Vaccine (3 - Booster for Pfizer series) 04/13/2020  . OPHTHALMOLOGY EXAM  07/12/2020    Depression screen [facility] 2/9 04/21/2020 04/19/2019 10/20/2018  Decreased Interest 0 0 0  Down, Depressed, Hopeless 0 0 0  PHQ - 2 Score 0 0 0  Altered sleeping 0 - -  Tired, decreased energy 0 - -  Change in appetite 0 - -  Feeling bad or failure about yourself  0 - -  Trouble concentrating 0 - -  Moving slowly or fidgety/restless 0 - -  Suicidal thoughts 0 - -  PHQ-9 Score 0 - -  Difficult doing work/chores Not difficult at all - -

## 2020-11-26 ENCOUNTER — Encounter: Payer: Self-pay | Admitting: Family Medicine

## 2020-11-26 ENCOUNTER — Other Ambulatory Visit: Payer: Self-pay | Admitting: Family Medicine

## 2020-11-26 ENCOUNTER — Ambulatory Visit (INDEPENDENT_AMBULATORY_CARE_PROVIDER_SITE_OTHER): Payer: 59 | Admitting: Family Medicine

## 2020-11-26 VITALS — BP 130/80 | HR 78 | Temp 97.9°F | Ht 68.0 in | Wt 245.0 lb

## 2020-11-26 DIAGNOSIS — Z7689 Persons encountering health services in other specified circumstances: Secondary | ICD-10-CM

## 2020-11-26 DIAGNOSIS — E785 Hyperlipidemia, unspecified: Secondary | ICD-10-CM | POA: Diagnosis not present

## 2020-11-26 DIAGNOSIS — E118 Type 2 diabetes mellitus with unspecified complications: Secondary | ICD-10-CM

## 2020-11-26 DIAGNOSIS — E1169 Type 2 diabetes mellitus with other specified complication: Secondary | ICD-10-CM | POA: Diagnosis not present

## 2020-11-26 LAB — POCT GLYCOSYLATED HEMOGLOBIN (HGB A1C): Hemoglobin A1C: 6.4 % — AB (ref 4.0–5.6)

## 2020-11-26 MED ORDER — ACCU-CHEK GUIDE VI STRP: ORAL_STRIP | 12 refills | Status: DC

## 2020-11-26 MED ORDER — ACCU-CHEK FASTCLIX LANCETS MISC: 1.0000 | Freq: Two times a day (BID) | 3 refills | Status: DC

## 2020-11-26 NOTE — Progress Notes (Signed)
Karla Chapman is a 60 y.o. female  Chief Complaint  Patient presents with  . Establish Care    Np here to discuss her A1c. Pt has not seen diabetic eye doctor.    HPI: Karla Chapman is a 60 y.o. female patient seen today to establish care with our office. Previous PCP was at [facility]. She has a PMHx significant for DM and associated hyperlipidemia.  She checks her BS at home once per day but not consistently. Her FBS 130-140.  She has not had DM eye exam. Diet: low carb diet since last summer Exercise: tries for 150 min per week. She does cardio or strength most week days, and then walking/hiking on weekend.  Mom and brother with DM.   Lab Results  Component Value Date   HGBA1C 6.4 (A) 11/26/2020   Lab Results  Component Value Date   CHOL 213 (H) 07/22/2020   HDL 49 07/22/2020   LDLCALC 147 (H) 07/22/2020   TRIG 95 07/22/2020   CHOLHDL 4.3 07/22/2020   Lab Results  Component Value Date   NA 139 07/22/2020   K 4.9 07/22/2020   CREATININE 0.91 07/22/2020   GFRNONAA 70 07/22/2020   GFRAA 80 07/22/2020   GLUCOSE 141 (H) 07/22/2020   Lab Results  Component Value Date   ALT 50 (H) 07/22/2020   AST 40 07/22/2020   ALKPHOS 100 07/22/2020   BILITOT 0.5 07/22/2020   The 10-year ASCVD risk score Denman George DC Jr., et al., 2013) is: 6.1%   Values used to calculate the score:     Age: 60 years     Sex: Female     Is Non-Hispanic African American: No     Diabetic: Yes     Tobacco smoker: No     Systolic Blood Pressure: 130 mmHg     Is BP treated: No     HDL Cholesterol: 49 mg/dL     Total Cholesterol: 213 mg/dL   History reviewed. No pertinent past medical history.  History reviewed. No pertinent surgical history.  Social History   Socioeconomic History  . Marital status: Married    Spouse name: Not on file  . Number of children: 0  . Years of education: Not on file  . Highest education level: Not on file  Occupational History  .  Occupation: Primary school teacher: [employer]  Tobacco Use  . Smoking status: Never Smoker  . Smokeless tobacco: Never Used  Vaping Use  . Vaping Use: Never used  Substance and Sexual Activity  . Alcohol use: Yes    Comment: 1-2 glasses of beer, or wine weekly   . Drug use: No  . Sexual activity: Not on file  Other Topics Concern  . Not on file  Social History Narrative  . Not on file   Social Determinants of Health   Financial Resource Strain: Not on file  Food Insecurity: Not on file  Transportation Needs: Not on file  Physical Activity: Not on file  Stress: Not on file  Social Connections: Not on file  Intimate Partner Violence: Not on file    Family History  Problem Relation Age of Onset  . Diabetes Mother   . Heart disease Mother 89  . Alcohol abuse Father   . Cancer Maternal Aunt   . Dementia Maternal Aunt      Immunization History  Administered Date(s) Administered  . Influenza-Unspecified 06/19/2017, 06/26/2018, 06/13/2019  . PFIZER(Purple Top)SARS-COV-2 Vaccination 09/24/2019, 10/15/2019,  06/27/2020  . Pneumococcal Polysaccharide-23 04/18/2018  . Zoster Recombinat (Shingrix) 04/21/2020, 07/22/2020    Outpatient Encounter Medications as of 11/26/2020  Medication Sig  . Garlic (GARLIQUE PO) Take by mouth.  . Omega-3 Fatty Acids (FISH OIL) 1000 MG CPDR Take by mouth.  . [DISCONTINUED] Accu-Chek FastClix Lancets MISC 1 each by Does not apply route 2 (two) times daily.  . [DISCONTINUED] glucose blood (ACCU-CHEK GUIDE) test strip Test BS twice a day  . Accu-Chek FastClix Lancets MISC 1 each by Does not apply route 2 (two) times daily.  Marland Kitchen glucose blood (ACCU-CHEK GUIDE) test strip Test BS twice a day  . [DISCONTINUED] metFORMIN (GLUCOPHAGE-XR) 500 MG 24 hr tablet Take 1 tablet (500 mg total) by mouth daily with breakfast.   No facility-administered encounter medications on file as of 11/26/2020.     ROS: Pertinent positives and negatives noted in HPI. Remainder  of ROS non-contributory   Allergies  Allergen Reactions  . Penicillins Rash    BP 130/80 (BP Location: Left Arm, Patient Position: Sitting, Cuff Size: Large)   Pulse 78   Temp 97.9 F (36.6 C) (Temporal)   Ht 5\' 8"  (1.727 m)   Wt 245 lb (111.1 kg)   SpO2 98%   BMI 37.25 kg/m   Wt Readings from Last 3 Encounters:  11/26/20 245 lb (111.1 kg)  04/21/20 253 lb (114.8 kg)  04/19/19 248 lb (112.5 kg)   Temp Readings from Last 3 Encounters:  11/26/20 97.9 F (36.6 C) (Temporal)  04/21/20 97.6 F (36.4 C) (Oral)  12/09/17 99 F (37.2 C) (Oral)   BP Readings from Last 3 Encounters:  11/26/20 130/80  04/21/20 126/84  04/19/19 124/76   Pulse Readings from Last 3 Encounters:  11/26/20 78  04/21/20 79  04/19/19 78     Physical Exam Constitutional:      General: She is not in acute distress.    Appearance: She is obese. She is not ill-appearing.  Cardiovascular:     Rate and Rhythm: Normal rate and regular rhythm.     Pulses: Normal pulses.     Heart sounds: Normal heart sounds.  Pulmonary:     Effort: Pulmonary effort is normal. No respiratory distress.     Breath sounds: Normal breath sounds. No wheezing or rhonchi.  Musculoskeletal:     Right lower leg: No edema.     Left lower leg: No edema.  Neurological:     Mental Status: She is alert.  Psychiatric:        Mood and Affect: Mood normal.        Behavior: Behavior normal.      A/P:  1. Type II diabetes mellitus with complication (HCC) - A1C improved from 7.3 to 6.4! - continue with regular CV exercise, low carb diet Refill: - Accu-Chek FastClix Lancets MISC; 1 each by Does not apply route 2 (two) times daily.  Dispense: 100 each; Refill: 3 - glucose blood (ACCU-CHEK GUIDE) test strip; Test BS twice a day  Dispense: 100 each; Refill: 12 - POCT HgB A1C - f/u in 3-4 mo or sooner PRN  2. Hyperlipidemia associated with type 2 diabetes mellitus (HCC) - LDL not at goal on 07/2020 FLP - pt has been  exercising regularly and takes omega 3 fish oil - recheck FLP at next OV in 3-4 mo  3. Encounter to establish care with new doctor   This visit occurred during the SARS-CoV-2 public health emergency.  Safety protocols were in place, including screening  questions prior to the visit, additional usage of staff PPE, and extensive cleaning of exam room while observing appropriate contact time as indicated for disinfecting solutions.

## 2021-04-03 ENCOUNTER — Ambulatory Visit: Payer: 59 | Admitting: Family Medicine

## 2021-07-06 ENCOUNTER — Other Ambulatory Visit: Payer: Self-pay | Admitting: Internal Medicine

## 2021-07-06 DIAGNOSIS — Z1231 Encounter for screening mammogram for malignant neoplasm of breast: Secondary | ICD-10-CM

## 2021-07-16 ENCOUNTER — Encounter: Payer: Self-pay | Admitting: Family Medicine

## 2021-08-06 ENCOUNTER — Other Ambulatory Visit: Payer: Self-pay

## 2021-08-06 ENCOUNTER — Ambulatory Visit
Admission: RE | Admit: 2021-08-06 | Discharge: 2021-08-06 | Disposition: A | Discharge status: Home / Self Care | Payer: 59 | Source: Ambulatory Visit

## 2021-08-06 DIAGNOSIS — Z1231 Encounter for screening mammogram for malignant neoplasm of breast: Secondary | ICD-10-CM | POA: Diagnosis not present

## 2021-10-26 ENCOUNTER — Other Ambulatory Visit: Payer: Self-pay

## 2021-10-26 ENCOUNTER — Encounter: Payer: Self-pay | Admitting: Family Medicine

## 2021-10-26 ENCOUNTER — Ambulatory Visit: Payer: 59 | Admitting: Family Medicine

## 2021-10-26 VITALS — BP 131/78 | HR 75 | Temp 98.0°F | Ht 67.0 in | Wt 249.0 lb

## 2021-10-26 DIAGNOSIS — Z1231 Encounter for screening mammogram for malignant neoplasm of breast: Secondary | ICD-10-CM | POA: Diagnosis not present

## 2021-10-26 DIAGNOSIS — E1169 Type 2 diabetes mellitus with other specified complication: Secondary | ICD-10-CM

## 2021-10-26 DIAGNOSIS — Z6837 Body mass index (BMI) 37.0-37.9, adult: Secondary | ICD-10-CM

## 2021-10-26 DIAGNOSIS — Z0000 Encounter for general adult medical examination without abnormal findings: Secondary | ICD-10-CM

## 2021-10-26 DIAGNOSIS — E661 Drug-induced obesity: Secondary | ICD-10-CM

## 2021-10-26 DIAGNOSIS — E785 Hyperlipidemia, unspecified: Secondary | ICD-10-CM | POA: Diagnosis not present

## 2021-10-26 LAB — COMPREHENSIVE METABOLIC PANEL
ALT: 22 U/L (ref 0–35)
AST: 20 U/L (ref 0–37)
Albumin: 4.6 g/dL (ref 3.5–5.2)
Alkaline Phosphatase: 79 U/L (ref 39–117)
BUN: 13 mg/dL (ref 6–23)
CO2: 30 mEq/L (ref 19–32)
Calcium: 10.2 mg/dL (ref 8.4–10.5)
Chloride: 104 mEq/L (ref 96–112)
Creatinine, Ser: 0.83 mg/dL (ref 0.40–1.20)
GFR: 77.01 mL/min (ref >60.00)
Glucose, Bld: 120 mg/dL — ABNORMAL HIGH (ref 70–99)
Potassium: 4 mEq/L (ref 3.5–5.1)
Sodium: 139 mEq/L (ref 135–145)
Total Bilirubin: 0.5 mg/dL (ref 0.2–1.2)
Total Protein: 7.6 g/dL (ref 6.0–8.3)

## 2021-10-26 LAB — CBC
HCT: 42.6 % (ref 36.0–46.0)
Hemoglobin: 14 g/dL (ref 12.0–15.0)
MCHC: 32.9 g/dL (ref 30.0–36.0)
MCV: 86.6 fl (ref 78.0–100.0)
Platelets: 273 10*3/uL (ref 150.0–400.0)
RBC: 4.91 Mil/uL (ref 3.87–5.11)
RDW: 16 % — ABNORMAL HIGH (ref 11.5–15.5)
WBC: 7 10*3/uL (ref 4.0–10.5)

## 2021-10-26 LAB — LIPID PANEL
Cholesterol: 257 mg/dL — ABNORMAL HIGH (ref 0–200)
HDL: 64.8 mg/dL (ref >39.00)
NonHDL: 192.19
Total CHOL/HDL Ratio: 4
Triglycerides: 214 mg/dL — ABNORMAL HIGH (ref 0.0–149.0)
VLDL: 42.8 mg/dL — ABNORMAL HIGH (ref 0.0–40.0)

## 2021-10-26 LAB — MICROALBUMIN / CREATININE URINE RATIO
Creatinine,U: 102.8 mg/dL
Microalb Creat Ratio: 2.1 mg/g (ref 0.0–30.0)
Microalb, Ur: 2.2 mg/dL — ABNORMAL HIGH (ref 0.0–1.9)

## 2021-10-26 LAB — HEMOGLOBIN A1C: Hgb A1c MFr Bld: 6.9 % — ABNORMAL HIGH (ref 4.6–6.5)

## 2021-10-26 LAB — LDL CHOLESTEROL, DIRECT: Direct LDL: 169 mg/dL

## 2021-10-26 LAB — TSH: TSH: 0.87 u[IU]/mL (ref 0.35–5.50)

## 2021-10-26 MED ORDER — DEXCOM G6 SENSOR MISC
11 refills | Status: DC
  Filled 2021-10-26: qty 3, 3d supply, fill #0
  Filled 2021-10-26: qty 3, 90d supply, fill #0
  Filled 2021-10-30: qty 3, 30d supply, fill #0
  Filled 2021-10-30: qty 3, 90d supply, fill #0

## 2021-10-26 MED ORDER — GLUCOSE BLOOD VI STRP
ORAL_STRIP | 12 refills | Status: AC
  Filled 2021-10-26: qty 100, 50d supply, fill #0

## 2021-10-26 MED ORDER — DEXCOM G6 TRANSMITTER MISC
3 refills | Status: DC
Start: 2021-10-26 — End: 2022-11-05
  Filled 2021-10-26: qty 1, 1d supply, fill #0
  Filled 2021-10-30: qty 1, 30d supply, fill #0
  Filled 2021-10-30: qty 1, 90d supply, fill #0

## 2021-10-26 MED ORDER — FREESTYLE LANCETS MISC
3 refills | Status: AC
  Filled 2021-10-26: qty 100, 50d supply, fill #0

## 2021-10-26 MED ORDER — DEXCOM G6 RECEIVER DEVI
0 refills | Status: DC
Start: 2021-10-26 — End: 2022-11-05
  Filled 2021-10-26 – 2021-10-30 (×2): qty 1, 30d supply, fill #0
  Filled 2021-10-30: qty 1, 90d supply, fill #0

## 2021-10-26 NOTE — Patient Instructions (Signed)
°Great to see you today.  °I have refilled the medication(s) we provide.  ° °If labs were collected, we will inform you of lab results once received either by echart message or telephone call.  ° - echart message- for normal results that have been seen by the patient already.  ° - telephone call: abnormal results or if patient has not viewed results in their echart. ° °Health Maintenance, Female °Adopting a healthy lifestyle and getting preventive care are important in promoting health and wellness. Ask your health care provider about: °The right schedule for you to have regular tests and exams. °Things you can do on your own to prevent diseases and keep yourself healthy. °What should I know about diet, weight, and exercise? °Eat a healthy diet ° °Eat a diet that includes plenty of vegetables, fruits, low-fat dairy products, and lean protein. °Do not eat a lot of foods that are high in solid fats, added sugars, or sodium. °Maintain a healthy weight °Body mass index (BMI) is used to identify weight problems. It estimates body fat based on height and weight. Your health care provider can help determine your BMI and help you achieve or maintain a healthy weight. °Get regular exercise °Get regular exercise. This is one of the most important things you can do for your health. Most adults should: °Exercise for at least 150 minutes each week. The exercise should increase your heart rate and make you sweat (moderate-intensity exercise). °Do strengthening exercises at least twice a week. This is in addition to the moderate-intensity exercise. °Spend less time sitting. Even light physical activity can be beneficial. °Watch cholesterol and blood lipids °Have your blood tested for lipids and cholesterol at 60 years of age, then have this test every 5 years. °Have your cholesterol levels checked more often if: °Your lipid or cholesterol levels are high. °You are older than 60 years of age. °You are at high risk for heart  disease. °What should I know about cancer screening? °Depending on your health history and family history, you may need to have cancer screening at various ages. This may include screening for: °Breast cancer. °Cervical cancer. °Colorectal cancer. °Skin cancer. °Lung cancer. °What should I know about heart disease, diabetes, and high blood pressure? °Blood pressure and heart disease °High blood pressure causes heart disease and increases the risk of stroke. This is more likely to develop in people who have high blood pressure readings or are overweight. °Have your blood pressure checked: °Every 3-5 years if you are 18-39 years of age. °Every year if you are 40 years old or older. °Diabetes °Have regular diabetes screenings. This checks your fasting blood sugar level. Have the screening done: °Once every three years after age 40 if you are at a normal weight and have a low risk for diabetes. °More often and at a younger age if you are overweight or have a high risk for diabetes. °What should I know about preventing infection? °Hepatitis B °If you have a higher risk for hepatitis B, you should be screened for this virus. Talk with your health care provider to find out if you are at risk for hepatitis B infection. °Hepatitis C °Testing is recommended for: °Everyone born from 1945 through 1965. °Anyone with known risk factors for hepatitis C. °Sexually transmitted infections (STIs) °Get screened for STIs, including gonorrhea and chlamydia, if: °You are sexually active and are younger than 60 years of age. °You are older than 60 years of age and your health care provider   tells you that you are at risk for this type of infection. °Your sexual activity has changed since you were last screened, and you are at increased risk for chlamydia or gonorrhea. Ask your health care provider if you are at risk. °Ask your health care provider about whether you are at high risk for HIV. Your health care provider may recommend a  prescription medicine to help prevent HIV infection. If you choose to take medicine to prevent HIV, you should first get tested for HIV. You should then be tested every 3 months for as long as you are taking the medicine. °Pregnancy °If you are about to stop having your period (premenopausal) and you may become pregnant, seek counseling before you get pregnant. °Take 400 to 800 micrograms (mcg) of folic acid every day if you become pregnant. °Ask for birth control (contraception) if you want to prevent pregnancy. °Osteoporosis and menopause °Osteoporosis is a disease in which the bones lose minerals and strength with aging. This can result in bone fractures. If you are 65 years old or older, or if you are at risk for osteoporosis and fractures, ask your health care provider if you should: °Be screened for bone loss. °Take a calcium or vitamin D supplement to lower your risk of fractures. °Be given hormone replacement therapy (HRT) to treat symptoms of menopause. °Follow these instructions at home: °Alcohol use °Do not drink alcohol if: °Your health care provider tells you not to drink. °You are pregnant, may be pregnant, or are planning to become pregnant. °If you drink alcohol: °Limit how much you have to: °0-1 drink a day. °Know how much alcohol is in your drink. In the [place] one drink equals one 12 oz bottle of beer (355 mL), one 5 oz glass of wine (148 mL), or one 1½ oz glass of hard liquor (44 mL). °Lifestyle °Do not use any products that contain nicotine or tobacco. These products include cigarettes, chewing tobacco, and vaping devices, such as e-cigarettes. If you need help quitting, ask your health care provider. °Do not use street drugs. °Do not share needles. °Ask your health care provider for help if you need support or information about quitting drugs. °General instructions °Schedule regular health, dental, and eye exams. °Stay current with your vaccines. °Tell your health care provider if: °You often  feel depressed. °You have ever been abused or do not feel safe at home. °Summary °Adopting a healthy lifestyle and getting preventive care are important in promoting health and wellness. °Follow your health care provider's instructions about healthy diet, exercising, and getting tested or screened for diseases. °Follow your health care provider's instructions on monitoring your cholesterol and blood pressure. °This information is not intended to replace advice given to you by your health care provider. Make sure you discuss any questions you have with your health care provider. °Document Revised: 01/12/2021 Document Reviewed: 01/12/2021 °Elsevier Patient Education © 2022 [employer] ° °

## 2021-10-26 NOTE — Progress Notes (Signed)
Patient ID: Karla Chapman, female  DOB: [birth year], 61 y.o.   MRN: [id number] Patient Care Team    Relationship Specialty Notifications Start End  Ma Hillock, DO PCP - General Family Medicine  10/26/21   Kem Parkinson, MD  Ophthalmology  10/20/18     Chief Complaint  Patient presents with   Establish Care    Pt is not fasting   Diabetes    Pt will like to have DM supplies change to the continuous blood glucose monitor     Subjective: Karla Chapman is a 61 y.o.  female present for new patient establishment-TOC. All past medical history, surgical history, allergies, family history, immunizations, medications and social history were updated in the electronic medical record today. All recent labs, ED visits and hospitalizations within the last year were reviewed.  Health maintenance:  Colonoscopy:cologuard  completed 05/06/2020- rpt 2024 Mammogram: completed:UTD 08/2021- [facility] Cervical cancer screening: last pap: 2019, results: nl, completed by: PCP- 5 yr Immunizations: tdap UTD 2013, Influenza UTD 2022 (encouraged yearly), zostavax UTD, covid UTD Infectious disease screening: HIV and Hep C completed DEXA: routine screen Assistive device: none Oxygen MLY:YTKP Patient has a Dental home. Hospitalizations/ED visits:review  Diabetes type 2 with hyperlipidemia:  Patient reports she had been diagnosed with diabetes and has been able to control with diet and exercise.  She states at one time she was prescribed medicine but never started because she was fearful she would have to stay on medicine forever if started.  She denies numbness, tingling of extremities, hypo/hyperglycemic events or non-healing wounds.   She reports she had been referred to diabetic nutrition classes.  She did not find these very helpful when she is trying to avoid carbs and sugar on her own.  Depression screen [facility] 2/9 10/26/2021 04/21/2020 04/19/2019 10/20/2018 04/18/2018  Decreased Interest 0 0 0  0 0  Down, Depressed, Hopeless 0 0 0 0 0  PHQ - 2 Score 0 0 0 0 0  Altered sleeping - 0 - - -  Tired, decreased energy - 0 - - -  Change in appetite - 0 - - -  Feeling bad or failure about yourself  - 0 - - -  Trouble concentrating - 0 - - -  Moving slowly or fidgety/restless - 0 - - -  Suicidal thoughts - 0 - - -  PHQ-9 Score - 0 - - -  Difficult doing work/chores - Not difficult at all - - -   GAD 7 : Generalized Anxiety Score 04/21/2020  Nervous, Anxious, on Edge 0  Control/stop worrying 0  Worry too much - different things 0  Trouble relaxing 0  Restless 0  Easily annoyed or irritable 0  Afraid - awful might happen 0  Total GAD 7 Score 0  Anxiety Difficulty Not difficult at all       Fall Risk  04/21/2020 10/20/2018  Falls in the past year? 0 0  Number falls in past yr: - 0  Injury with Fall? - 0  Risk for fall due to : No Fall Risks -  Follow up Falls evaluation completed Falls evaluation completed   Immunization History  Administered Date(s) Administered   Influenza-Unspecified 06/19/2017, 06/26/2018, 06/13/2019, 06/16/2021   PFIZER(Purple Top)SARS-COV-2 Vaccination 09/24/2019, 10/15/2019, 06/27/2020   Pneumococcal Polysaccharide-23 04/18/2018   Zoster Recombinat (Shingrix) 04/21/2020, 07/22/2020    No results found.  Past Medical History:  Diagnosis Date   Diabetes ([place])    Hyperlipidemia    Allergies  Allergen Reactions   Penicillins Rash   Past Surgical History:  Procedure Laterality Date   NO PAST SURGERIES     Family History  Problem Relation Age of Onset   Cancer Mother    Diabetes Mother    Heart disease Mother 3   Heart attack Mother    Alcohol abuse Father    Early death Father 76   Cancer Brother    Alcohol abuse Brother    Diabetes Brother    Cancer Maternal Aunt    Dementia Maternal Aunt    Social History   Social History Narrative   Marital status/children/pets: married, G0P0   Education/employment: Brewing technologist, Education administrator    Safety:      -smoke alarm in the home:Yes     - wears seatbelt: Yes     - Feels safe in their relationships: Yes       Allergies as of 10/26/2021       Reactions   Penicillins Rash        Medication List        Accurate as of October 26, 2021 12:22 PM. If you have any questions, ask your nurse or doctor.          Dexcom G6 Receiver Devi Monitor blood glucose 2-3 times daily Started by: Howard Pouch, DO   Dexcom G6 Sensor Misc Monitor blood glucoses 2-3 times daily Started by: Howard Pouch, DO   Dexcom G6 Transmitter Misc Monitor blood glucoses 2-3 times daily Started by: Howard Pouch, DO   Fish Oil 1000 MG Cpdr Take by mouth.   FreeStyle Freedom Lite w/Device Kit USE AS DIRECTED   freestyle lancets USE TWO TIMES DAILY What changed: Another medication with the same name was removed. Continue taking this medication, and follow the directions you see here. Changed by: Howard Pouch, DO   GARLIQUE PO Take by mouth.   glucose blood test strip TEST BLOOD SUGAR TWICE A DAY        All past medical history, surgical history, allergies, family history, immunizations andmedications were updated in the EMR today and reviewed under the history and medication portions of their EMR.    No results found for this or any previous visit (from the past 2160 hour(s)).  MM 3D SCREEN BREAST BILATERAL Result Date: 08/07/2021 RECOMMENDATION: Screening mammogram in one year. (Code:[id number]-B-01Y) BI-RADS CATEGORY  1: Negative. Electronically Signed   By: Abelardo Diesel M.D.   On: 08/07/2021 09:23   ROS 14 pt review of systems performed and negative (unless mentioned in an HPI)  Objective: BP 131/78    Pulse 75    Temp 98 F (36.7 C) (Oral)    Ht '5\' 7"'  (1.702 m)    Wt 249 lb (112.9 kg)    SpO2 96%    BMI 39.00 kg/m  Physical Exam Vitals and nursing note reviewed.  Constitutional:      General: She is not in acute distress.    Appearance: Normal appearance. She is obese. She is  not ill-appearing or toxic-appearing.  HENT:     Head: Normocephalic and atraumatic.     Right Ear: Tympanic membrane, ear canal and external ear normal. There is no impacted cerumen.     Left Ear: Tympanic membrane, ear canal and external ear normal. There is no impacted cerumen.     Nose: No congestion or rhinorrhea.     Mouth/Throat:     Mouth: Mucous membranes are moist.     Pharynx: Oropharynx is clear. No  oropharyngeal exudate or posterior oropharyngeal erythema.  Eyes:     General: No scleral icterus.       Right eye: No discharge.        Left eye: No discharge.     Extraocular Movements: Extraocular movements intact.     Conjunctiva/sclera: Conjunctivae normal.     Pupils: Pupils are equal, round, and reactive to light.  Cardiovascular:     Rate and Rhythm: Normal rate and regular rhythm.     Pulses: Normal pulses.     Heart sounds: Normal heart sounds. No murmur heard.   No friction rub. No gallop.  Pulmonary:     Effort: Pulmonary effort is normal. No respiratory distress.     Breath sounds: Normal breath sounds. No stridor. No wheezing, rhonchi or rales.  Chest:     Chest wall: No tenderness.  Abdominal:     General: Abdomen is flat. Bowel sounds are normal. There is no distension.     Palpations: Abdomen is soft. There is no mass.     Tenderness: There is no abdominal tenderness. There is no right CVA tenderness, left CVA tenderness, guarding or rebound.     Hernia: No hernia is present.  Musculoskeletal:        General: No swelling, tenderness or deformity. Normal range of motion.     Cervical back: Normal range of motion and neck supple. No rigidity or tenderness.     Right lower leg: No edema.     Left lower leg: No edema.  Lymphadenopathy:     Cervical: No cervical adenopathy.  Skin:    General: Skin is warm and dry.     Coloration: Skin is not jaundiced or pale.     Findings: No bruising, erythema, lesion or rash.  Neurological:     General: No focal  deficit present.     Mental Status: She is alert and oriented to person, place, and time. Mental status is at baseline.     Cranial Nerves: No cranial nerve deficit.     Sensory: No sensory deficit.     Motor: No weakness.     Coordination: Coordination normal.     Gait: Gait normal.     Deep Tendon Reflexes: Reflexes normal.  Psychiatric:        Mood and Affect: Mood normal.        Behavior: Behavior normal.        Thought Content: Thought content normal.        Judgment: Judgment normal.     Assessment/plan: Karla Chapman is a 61 y.o. female present for CPE-TOC Type 2 diabetes mellitus with hyperlipidemia (HCC)/Morbid obesity (HCC)Class 2 drug-induced obesity with serious comorbidity and body mass index (BMI) of 37.0 to 37.9 in adult/ 1C collected today.  Her last A1c in the system was 6.4 and she has been able to maintain adequate control with diet and exercise.  Highest A1c in the system is 7.4.  Never been on medications.  Hopefully she can continue to control with diet and exercise.   We had a lengthy conversation today surrounding diabetes medication, protection of heart, eyes kidneys etc.  Reassured her if occasion is appropriate to help treat her diabetes, it is not necessarily forever.  That is dependent upon her A1c. - Urine Microalbumin w/creat. ratio - CBC - Comp Met (CMET) - Hemoglobin A1c - TSH - Lipid panel - HM DIABETES FOOT EXAM - glucose blood test strip; TEST BLOOD SUGAR TWICE A DAY  Dispense:  100 strip; Refill: 12 Encouraged her to schedule her eye exam. She desired the Dexcom glucose checking system.  She understands this may not be covered by her insurance and if so she is willing to pay out-of-pocket.  Breast cancer screening by mammogram - MM 3D SCREEN BREAST BILATERAL; Future  Routine general medical examination at a health care facility Colonoscopy:cologuard  completed 05/06/2020- rpt 2024 Mammogram: completed:UTD 08/2021- [facility] Cervical cancer  screening: last pap: 2019, results: nl, completed by: PCP- 5 yr Immunizations: tdap UTD 2013, Influenza UTD 2022 (encouraged yearly), zostavax UTD, covid UTD Infectious disease screening: HIV and Hep C completed DEXA: routine screen Patient was encouraged to exercise greater than 150 minutes a week. Patient was encouraged to choose a diet filled with fresh fruits and vegetables, and lean meats. AVS provided to patient today for education/recommendation on gender specific health and safety maintenance.  Return in about 24 weeks (around 04/12/2022) for CMC (30 min).  Orders Placed This Encounter  Procedures   MM 3D SCREEN BREAST BILATERAL   Urine Microalbumin w/creat. ratio   CBC   Comp Met (CMET)   Hemoglobin A1c   TSH   Lipid panel   HM DIABETES FOOT EXAM   Meds ordered this encounter  Medications   Lancets (FREESTYLE) lancets    Sig: USE TWO TIMES DAILY    Dispense:  100 each    Refill:  3    E11.69   glucose blood test strip    Sig: TEST BLOOD SUGAR TWICE A DAY    Dispense:  100 strip    Refill:  12   Continuous Blood Gluc Sensor (DEXCOM G6 SENSOR) MISC    Sig: Monitor blood glucoses 2-3 times daily    Dispense:  3 each    Refill:  11    E11.69   Continuous Blood Gluc Transmit (DEXCOM G6 TRANSMITTER) MISC    Sig: Monitor blood glucoses 2-3 times daily    Dispense:  1 each    Refill:  3    E11.69   Continuous Blood Gluc Receiver (DEXCOM G6 RECEIVER) DEVI    Sig: Monitor blood glucose 2-3 times daily    Dispense:  1 each    Refill:  0    E11.69   Referral Orders  No referral(s) requested today     Note is dictated utilizing voice recognition software. Although note has been proof read prior to signing, occasional typographical errors still can be missed. If any questions arise, please do not hesitate to call for verification.  Electronically signed by: Howard Pouch, DO [place]

## 2021-10-27 ENCOUNTER — Telehealth: Payer: Self-pay | Admitting: Family Medicine

## 2021-10-27 DIAGNOSIS — E1169 Type 2 diabetes mellitus with other specified complication: Secondary | ICD-10-CM

## 2021-10-27 DIAGNOSIS — E785 Hyperlipidemia, unspecified: Secondary | ICD-10-CM

## 2021-10-27 DIAGNOSIS — Z5320 Procedure and treatment not carried out because of patient's decision for unspecified reasons: Secondary | ICD-10-CM

## 2021-10-27 NOTE — Telephone Encounter (Signed)
Please call patient Liver, kidney and thyroid function are normal Blood cell counts and electrolytes are normal Urine microalbumin creatinine ratio is in normal range.   Cholesterol panel is abnormal.  Triglycerides look mildly elevated, however this is not concerning since it is a nonfasting lab this is in normal range.  Her HDL/good cholesterol is excellent at 64.8.  Unfortunately, her LDL/bad cholesterol is 169.  For a person with diabetes goal LDL is at least below 100 and near 70 is best.  A cholesterol of 169 puts her at higher cardiovascular risk.  Of course routine exercise and dietary modifications can be helpful.  Genetics also plays a factor in cholesterol.  I know she is working on her exercise and diet plan.  I would encourage her to consider starting over-the-counter red yeast rice supplement daily (follow instructions on label for dosage).  A1c is in the treatable diabetic range at 6.9.    -It would be beneficial for her health to consider starting medication such as metformin.  Metformin works with the body/liver to reduce glucose storage.  This would not be a medication she would have to stay on forever if A1c lowers into normal range.  Metformin is also a medication she would not have to worry about having "low sugars "it does not have that effect.  If she is agreeable to start, I will call this in for her today it would be a low-dose of metformin with breakfast.  Follow-up in 4 months with provider and we will recheck her levels and see if any additional assistance needs to be provided.

## 2021-10-27 NOTE — Telephone Encounter (Signed)
LM for pt to return call to discuss.  

## 2021-10-28 DIAGNOSIS — Z5320 Procedure and treatment not carried out because of patient's decision for unspecified reasons: Secondary | ICD-10-CM | POA: Insufficient documentation

## 2021-10-28 NOTE — Telephone Encounter (Signed)
Pt declines starting medication

## 2021-10-29 ENCOUNTER — Other Ambulatory Visit: Payer: Self-pay

## 2021-10-30 ENCOUNTER — Other Ambulatory Visit: Payer: Self-pay

## 2021-11-02 ENCOUNTER — Other Ambulatory Visit: Payer: Self-pay

## 2022-01-25 ENCOUNTER — Encounter: Payer: Self-pay | Admitting: Family Medicine

## 2022-01-26 NOTE — Telephone Encounter (Signed)
Lats CPE 10/26/21. Please advise

## 2022-01-26 NOTE — Telephone Encounter (Signed)
She can have tdap here if desired.

## 2022-02-26 ENCOUNTER — Ambulatory Visit: Payer: 59 | Admitting: Family Medicine

## 2022-03-19 ENCOUNTER — Ambulatory Visit: Payer: 59 | Admitting: Family Medicine

## 2022-03-19 ENCOUNTER — Encounter: Payer: Self-pay | Admitting: Family Medicine

## 2022-03-19 VITALS — BP 133/75 | HR 61 | Temp 98.0°F | Ht 67.0 in | Wt 238.0 lb

## 2022-03-19 DIAGNOSIS — Z5320 Procedure and treatment not carried out because of patient's decision for unspecified reasons: Secondary | ICD-10-CM

## 2022-03-19 DIAGNOSIS — E1169 Type 2 diabetes mellitus with other specified complication: Secondary | ICD-10-CM

## 2022-03-19 DIAGNOSIS — E785 Hyperlipidemia, unspecified: Secondary | ICD-10-CM | POA: Diagnosis not present

## 2022-03-19 LAB — LIPID PANEL
Cholesterol: 237 mg/dL — ABNORMAL HIGH (ref 0–200)
HDL: 51.8 mg/dL (ref >39.00)
LDL Cholesterol: 154 mg/dL — ABNORMAL HIGH (ref 0–99)
NonHDL: 185.51
Total CHOL/HDL Ratio: 5
Triglycerides: 160 mg/dL — ABNORMAL HIGH (ref 0.0–149.0)
VLDL: 32 mg/dL (ref 0.0–40.0)

## 2022-03-19 LAB — HEMOGLOBIN A1C: Hgb A1c MFr Bld: 7 % — ABNORMAL HIGH (ref 4.6–6.5)

## 2022-03-19 NOTE — Progress Notes (Signed)
Patient ID: Karla Chapman, female  DOB: 05-14-1961, 61 y.o.   MRN: [id number] Patient Care Team    Relationship Specialty Notifications Start End  Ma Hillock, DO PCP - General Family Medicine  10/26/21   Kem Parkinson, MD  Ophthalmology  10/20/18     Chief Complaint  Patient presents with   Diabetes    [facility]; pt is fasting    Subjective: Karla Chapman is a 61 y.o.  female present for new patient [facility] All past medical history, surgical history, allergies, family history, immunizations, medications and social history were updated in the electronic medical record today. All recent labs, ED visits and hospitalizations within the last year were reviewed.   Diabetes type 2 with hyperlipidemia:  Patient reports she had been diagnosed with diabetes and has been able to control with diet and exercise.  She states at one time she was prescribed medicine but never started because she was fearful she would have to stay on medicine forever if started.  Last a1c was 6.9 pt declined start of metformin. Patient denies dizziness, hyperglycemic or hypoglycemic events. Patient denies numbness, tingling in the extremities or nonhealing wounds of feet.  She reports she had been referred to diabetic nutrition classes.  She did not find these very helpful and she is trying to avoid carbs and sugar on her own. Last ldl 169. Pt has declined cholesterol medications.  She lost 11 LBS!!!! She reports she decreased her portion size and went down to 2 meals a day.  She started preparing her meals at home and using more fresh product.  She exercises about 5 times oh week averaging at least 150 minutes a week or more.  Her exercise includes walking, cardio and strength workouts. She is taking the fish oil supplement 1000 mg daily. She reports her average glucose is around 100-120 and the highest she seen was after meal was 180     10/26/2021   10:28 AM 04/21/2020    9:00 AM 04/19/2019    8:23 AM  10/20/2018    1:49 PM 04/18/2018   10:59 AM  Depression screen PHQ 2/9  Decreased Interest 0 0 0 0 0  Down, Depressed, Hopeless 0 0 0 0 0  PHQ - 2 Score 0 0 0 0 0  Altered sleeping  0     Tired, decreased energy  0     Change in appetite  0     Feeling bad or failure about yourself   0     Trouble concentrating  0     Moving slowly or fidgety/restless  0     Suicidal thoughts  0     PHQ-9 Score  0     Difficult doing work/chores  Not difficult at all         04/21/2020    9:00 AM  GAD 7 : Generalized Anxiety Score  Nervous, Anxious, on Edge 0  Control/stop worrying 0  Worry too much - different things 0  Trouble relaxing 0  Restless 0  Easily annoyed or irritable 0  Afraid - awful might happen 0  Total GAD 7 Score 0  Anxiety Difficulty Not difficult at all          04/21/2020    9:00 AM 10/20/2018    1:49 PM  Fall Risk   Falls in the past year? 0 0  Number falls in past yr:  0  Injury with Fall?  0  Risk for  fall due to : No Fall Risks   Follow up Falls evaluation completed Falls evaluation completed   Immunization History  Administered Date(s) Administered   Influenza-Unspecified 06/19/2017, 06/26/2018, 06/13/2019, 06/16/2021   PFIZER(Purple Top)SARS-COV-2 Vaccination 09/24/2019, 10/15/2019, 06/27/2020   Pneumococcal Polysaccharide-23 04/18/2018   Zoster Recombinat (Shingrix) 04/21/2020, 07/22/2020    No results found.  Past Medical History:  Diagnosis Date   Diabetes (HCC)    Hyperlipidemia    Allergies  Allergen Reactions   Penicillins Rash   Past Surgical History:  Procedure Laterality Date   NO PAST SURGERIES     Family History  Problem Relation Age of Onset   Cancer Mother    Diabetes Mother    Heart disease Mother 60   Heart attack Mother    Alcohol abuse Father    Early death Father 27   Cancer Brother    Alcohol abuse Brother    Diabetes Brother    Cancer Maternal Aunt    Dementia Maternal Aunt    Social History   Social History  Narrative   Marital status/children/pets: married, G0P0   Education/employment: Education administrator, Optician, dispensing   Safety:      -smoke alarm in the home:Yes     - wears seatbelt: Yes     - Feels safe in their relationships: Yes       Allergies as of 03/19/2022       Reactions   Penicillins Rash        Medication List        Accurate as of March 19, 2022  9:21 AM. If you have any questions, ask your nurse or doctor.          Dexcom G6 Receiver Devi Monitor blood glucose 2-3 times daily   Dexcom G6 Sensor Misc Monitor blood glucoses 2-3 times daily   Dexcom G6 Transmitter Misc Monitor blood glucoses 2-3 times daily   Fish Oil 1000 MG Cpdr Take by mouth.   freestyle lancets USE TWO TIMES DAILY   FREESTYLE LITE test strip Generic drug: glucose blood TEST BLOOD SUGAR TWICE A DAY   GARLIQUE PO Take by mouth.        All past medical history, surgical history, allergies, family history, immunizations andmedications were updated in the EMR today and reviewed under the history and medication portions of their EMR.    No results found for this or any previous visit (from the past 2160 hour(s)).   ROS 14 pt review of systems performed and negative (unless mentioned in an HPI)  Objective: BP 133/75   Pulse 61   Temp 98 F (36.7 C) (Oral)   Ht 5\' 7"  (1.702 m)   Wt 238 lb (108 kg)   SpO2 98%   BMI 37.28 kg/m  Physical Exam Vitals and nursing note reviewed.  Constitutional:      General: She is not in acute distress.    Appearance: Normal appearance. She is not ill-appearing, toxic-appearing or diaphoretic.  HENT:     Head: Normocephalic and atraumatic.  Eyes:     General: No scleral icterus.       Right eye: No discharge.        Left eye: No discharge.     Extraocular Movements: Extraocular movements intact.     Conjunctiva/sclera: Conjunctivae normal.     Pupils: Pupils are equal, round, and reactive to light.  Cardiovascular:     Rate and Rhythm: Normal rate  and regular rhythm.  Pulmonary:     Effort:  Pulmonary effort is normal. No respiratory distress.     Breath sounds: Normal breath sounds. No wheezing, rhonchi or rales.  Musculoskeletal:     Cervical back: Neck supple. No tenderness.     Right lower leg: No edema.     Left lower leg: No edema.  Lymphadenopathy:     Cervical: No cervical adenopathy.  Skin:    General: Skin is warm and dry.     Coloration: Skin is not jaundiced or pale.     Findings: No erythema or rash.  Neurological:     Mental Status: She is alert and oriented to person, place, and time. Mental status is at baseline.     Motor: No weakness.     Gait: Gait normal.  Psychiatric:        Mood and Affect: Mood normal.        Behavior: Behavior normal.        Thought Content: Thought content normal.        Judgment: Judgment normal.      Assessment/plan: Karla Chapman is a 61 y.o. female present for [facility] Type 2 diabetes mellitus with hyperlipidemia (HCC)/Morbid obesity (HCC)Class 2 drug-induced obesity with serious comorbidity and body mass index (BMI) of 37.0 to 37.9 in adult/ Highest A1c in the system is 7.4.   We had a lengthy conversation today surrounding diabetes medication, protection of heart, eyes kidneys etc.  Reassured her if occasion is appropriate to help treat her diabetes, it is not necessarily forever.   She desired the Dexcom glucose checking system.  She understands this may not be covered by her insurance and if so she is willing to pay out-of-pocket. A1c and lipids collected today PNA series: completed. Rpt after 61yo Flu shot:  (recommneded yearly) Foot exam: 03/19/2022 A1c: 7.4>6.9> collected today Lipid collected today Lost 11 LBS!!!  Return in about 7 months (around 10/28/2022) for cpe (20 min).  Orders Placed This Encounter  Procedures   Hemoglobin A1c   Lipid panel   No orders of the defined types were placed in this encounter.  Referral Orders  No referral(s) requested today      Note is dictated utilizing voice recognition software. Although note has been proof read prior to signing, occasional typographical errors still can be missed. If any questions arise, please do not hesitate to call for verification.  Electronically signed by: Felix Pacini, DO [facility] [place]

## 2022-03-19 NOTE — Patient Instructions (Addendum)
Return in about 7 months (around 10/28/2022) for cpe (20 min).        Great to see you today.  I have refilled the medication(s) we provide.   If labs were collected, we will inform you of lab results once received either by echart message or telephone call.   - echart message- for normal results that have been seen by the patient already.   - telephone call: abnormal results or if patient has not viewed results in their echart.

## 2022-03-22 ENCOUNTER — Telehealth: Payer: Self-pay | Admitting: Family Medicine

## 2022-03-22 NOTE — Telephone Encounter (Signed)
Please call patient: Although her LDL appears mildly improved at 154, was 169.  It is still elevated above goal.  Her A1c went up to 7.0.  They know this is frustrating for her because she has been working so hard at this.  Please remind her it is important to be the best version of ourselves by dieting and exercising, but sometimes are medical conditions are genetic.   I know she would rather not start medications.  However with her cholesterol levels, diabetes history and family history of heart disease she would benefit over starting a cholesterol medication.  I will call this in today if she is agreeable to start medication.  As far as her diabetes, she would benefit from starting on a medication called metformin.  If she would prefer we can first start with a cholesterol medication while she works on her diet since her A1c is not above 7.0 currently.

## 2022-03-22 NOTE — Telephone Encounter (Signed)
Spoke with pt regarding labs and instructions. Pt declined medications at this time.

## 2022-04-08 DIAGNOSIS — H5213 Myopia, bilateral: Secondary | ICD-10-CM | POA: Diagnosis not present

## 2022-04-08 DIAGNOSIS — R7303 Prediabetes: Secondary | ICD-10-CM | POA: Diagnosis not present

## 2022-04-08 LAB — HM DIABETES EYE EXAM

## 2022-04-16 ENCOUNTER — Ambulatory Visit: Payer: 59 | Admitting: Family Medicine

## 2022-04-22 LAB — HM DIABETES EYE EXAM

## 2022-08-20 ENCOUNTER — Ambulatory Visit
Admission: RE | Admit: 2022-08-20 | Discharge: 2022-08-20 | Disposition: A | Discharge status: Home / Self Care | Payer: 59 | Source: Ambulatory Visit | Attending: Family Medicine | Admitting: Family Medicine

## 2022-08-20 DIAGNOSIS — Z1231 Encounter for screening mammogram for malignant neoplasm of breast: Secondary | ICD-10-CM

## 2022-11-05 ENCOUNTER — Encounter: Payer: Self-pay | Admitting: Family Medicine

## 2022-11-05 ENCOUNTER — Ambulatory Visit (INDEPENDENT_AMBULATORY_CARE_PROVIDER_SITE_OTHER): Payer: Commercial Managed Care - PPO | Admitting: Family Medicine

## 2022-11-05 VITALS — BP 130/73 | HR 74 | Temp 98.4°F | Ht 68.11 in | Wt 239.6 lb

## 2022-11-05 DIAGNOSIS — E2839 Other primary ovarian failure: Secondary | ICD-10-CM | POA: Diagnosis not present

## 2022-11-05 DIAGNOSIS — Z1231 Encounter for screening mammogram for malignant neoplasm of breast: Secondary | ICD-10-CM | POA: Diagnosis not present

## 2022-11-05 DIAGNOSIS — E785 Hyperlipidemia, unspecified: Secondary | ICD-10-CM | POA: Diagnosis not present

## 2022-11-05 DIAGNOSIS — Z23 Encounter for immunization: Secondary | ICD-10-CM

## 2022-11-05 DIAGNOSIS — E1169 Type 2 diabetes mellitus with other specified complication: Secondary | ICD-10-CM

## 2022-11-05 DIAGNOSIS — Z130 Encounter for screening for diseases of the blood and blood-forming organs and certain disorders involving the immune mechanism: Secondary | ICD-10-CM

## 2022-11-05 DIAGNOSIS — Z0000 Encounter for general adult medical examination without abnormal findings: Secondary | ICD-10-CM | POA: Diagnosis not present

## 2022-11-05 LAB — MICROALBUMIN / CREATININE URINE RATIO
Creatinine,U: 150.5 mg/dL
Microalb Creat Ratio: 1.1 mg/g (ref 0.0–30.0)
Microalb, Ur: 1.6 mg/dL (ref 0.0–1.9)

## 2022-11-05 NOTE — Progress Notes (Signed)
Patient ID: Karla Chapman, female  DOB: Jan 18, 1961, 62 y.o.   MRN: [id number] Patient Care Team    Relationship Specialty Notifications Start End  Ma Hillock, DO PCP - General Family Medicine  10/26/21   Kem Parkinson, MD  Ophthalmology  10/20/18     Chief Complaint  Patient presents with   Annual Exam    [facility];Pt is fasting    Subjective: Karla Chapman is a 62 y.o.  female present for new patient CPE with Chronic Conditions/illness Management  All past medical history, surgical history, allergies, family history, immunizations, medications and social history were updated in the electronic medical record today. All recent labs, ED visits and hospitalizations within the last year were reviewed.   Health maintenance:  Colonoscopy:cologuard  completed 05/06/2020- rpt 04/2023> rpt cologuard in August Mammogram: completed:UTD 08/20/2022- [facility]- placed order for 2024 Cervical cancer screening: last pap: 2021, results: nl, completed by: PCP- 5 yr Immunizations: tdap UTD today, Influenza UTD 2023 (encouraged yearly), zostavax UTD Infectious disease screening: HIV and Hep C completed DEXA: routine screen Assistive device: none Oxygen [id number] Patient has a Dental home. Hospitalizations/ED visits:reviewed  Diabetes type 2 with hyperlipidemia:  Patient reports she had been diagnosed with diabetes and has been able to control with diet and exercise.  She states at one time she was prescribed medicine but never started because she was fearful she would have to stay on medicine forever if started.  Last a1c was 6.9 pt declined start of metformin.  Patient denies dizziness, hyperglycemic or hypoglycemic events. Patient denies numbness, tingling in the extremities or nonhealing wounds of feet.   She reports she had been referred to diabetic nutrition classes.  She did not find these very helpful and she is trying to avoid carbs and sugar on her own. Last ldl 169. Pt has  declined cholesterol medications.  She reports she decreased her portion size and went down to 2 meals a day.  She started preparing her meals at home and using more fresh product.  She exercises about 5 times /week averaging at least 150 minutes a week or more.  Her exercise includes walking, cardio and strength workouts. She is taking the fish oil supplement 1000 mg daily.      10/26/2021   10:28 AM 04/21/2020    9:00 AM 04/19/2019    8:23 AM 10/20/2018    1:49 PM 04/18/2018   10:59 AM  Depression screen PHQ 2/9  Decreased Interest 0 0 0 0 0  Down, Depressed, Hopeless 0 0 0 0 0  PHQ - 2 Score 0 0 0 0 0  Altered sleeping  0     Tired, decreased energy  0     Change in appetite  0     Feeling bad or failure about yourself   0     Trouble concentrating  0     Moving slowly or fidgety/restless  0     Suicidal thoughts  0     PHQ-9 Score  0     Difficult doing work/chores  Not difficult at all         04/21/2020    9:00 AM  GAD 7 : Generalized Anxiety Score  Nervous, Anxious, on Edge 0  Control/stop worrying 0  Worry too much - different things 0  Trouble relaxing 0  Restless 0  Easily annoyed or irritable 0  Afraid - awful might happen 0  Total GAD 7 Score 0  Anxiety Difficulty  Not difficult at all          04/21/2020    9:00 AM 10/20/2018    1:49 PM  [place] in the past year? 0 0  Number falls in past yr:  0  Injury with Fall?  0  Risk for fall due to : No Fall Risks   Follow up Falls evaluation completed Falls evaluation completed   Immunization History  Administered Date(s) Administered   Influenza-Unspecified 06/19/2017, 06/26/2018, 06/13/2019, 06/16/2021, 06/11/2022   PFIZER(Purple Top)SARS-COV-2 Vaccination 09/24/2019, 10/15/2019, 06/27/2020   Pneumococcal Polysaccharide-23 04/18/2018   Tdap 11/05/2022   Zoster Recombinat (Shingrix) 04/21/2020, 07/22/2020    No results found.  Past Medical History:  Diagnosis Date   Diabetes ([place])     Hyperlipidemia    Allergies  Allergen Reactions   Penicillins Rash   Past Surgical History:  Procedure Laterality Date   NO PAST SURGERIES     Family History  Problem Relation Age of Onset   Cancer Mother    Diabetes Mother    Heart disease Mother 63   Heart attack Mother    Alcohol abuse Father    Early death Father 67   Cancer Brother    Alcohol abuse Brother    Diabetes Brother    Cancer Maternal Aunt    Dementia Maternal Aunt    Social History   Social History Narrative   Marital status/children/pets: married, G0P0   Education/employment: Brewing technologist, Education administrator   Safety:      -smoke alarm in the home:Yes     - wears seatbelt: Yes     - Feels safe in their relationships: Yes       Allergies as of 11/05/2022       Reactions   Penicillins Rash        Medication List        Accurate as of November 05, 2022  2:24 PM. If you have any questions, ask your nurse or doctor.          STOP taking these medications    Dexcom G6 Receiver Devi Stopped by: Howard Pouch, DO   Dexcom G6 Sensor Misc Stopped by: Howard Pouch, DO   Dexcom G6 Transmitter Misc Stopped by: Howard Pouch, DO       TAKE these medications    Fish Oil 1000 MG Cpdr Take by mouth.   GARLIQUE PO Take by mouth.   multivitamin capsule Take 1 capsule by mouth daily.        All past medical history, surgical history, allergies, family history, immunizations andmedications were updated in the EMR today and reviewed under the history and medication portions of their EMR.    No results found for this or any previous visit (from the past 2160 hour(s)).   ROS 14 pt review of systems performed and negative (unless mentioned in an HPI)  Objective: BP 130/73   Pulse 74   Temp 98.4 F (36.9 C)   Ht 5' 8.11" (1.73 m)   Wt 239 lb 9.6 oz (108.7 kg)   SpO2 98%   BMI 36.31 kg/m  Physical Exam Vitals and nursing note reviewed.  Constitutional:      General: She is not in acute  distress.    Appearance: Normal appearance. She is not ill-appearing, toxic-appearing or diaphoretic.  HENT:     Head: Normocephalic and atraumatic.  Eyes:     General: No scleral icterus.       Right eye: No discharge.  Left eye: No discharge.     Extraocular Movements: Extraocular movements intact.     Conjunctiva/sclera: Conjunctivae normal.     Pupils: Pupils are equal, round, and reactive to light.  Cardiovascular:     Rate and Rhythm: Normal rate and regular rhythm.  Pulmonary:     Effort: Pulmonary effort is normal. No respiratory distress.     Breath sounds: Normal breath sounds. No wheezing, rhonchi or rales.  Musculoskeletal:     Cervical back: Neck supple. No tenderness.     Right lower leg: No edema.     Left lower leg: No edema.  Lymphadenopathy:     Cervical: No cervical adenopathy.  Skin:    General: Skin is warm and dry.     Coloration: Skin is not jaundiced or pale.     Findings: No erythema or rash.  Neurological:     Mental Status: She is alert and oriented to person, place, and time. Mental status is at baseline.     Motor: No weakness.     Gait: Gait normal.  Psychiatric:        Mood and Affect: Mood normal.        Behavior: Behavior normal.        Thought Content: Thought content normal.        Judgment: Judgment normal.     Diabetic Foot Exam - Simple   Simple Foot Form Diabetic Foot exam was performed with the following findings: Yes 11/05/2022  2:04 PM  Visual Inspection No deformities, no ulcerations, no other skin breakdown bilaterally: Yes Sensation Testing Intact to touch and monofilament testing bilaterally: Yes Pulse Check Posterior Tibialis and Dorsalis pulse intact bilaterally: Yes Comments     Assessment/plan: Karla Chapman is a 62 y.o. female present for CPE and Chronic Conditions/illness Management Type 2 diabetes mellitus with hyperlipidemia (HCC)/Morbid obesity (HCC)Class 2 drug-induced obesity with serious  comorbidity and body mass index (BMI) of 37.0 to 37.9 in adult/ Highest A1c in the system is 7.4.   conversation surrounding diabetes medication, protection of heart, eyes kidneys etc.  Reassured her if occasion is appropriate to help treat her diabetes, it is not necessarily forever , has been had.  She desired the Dexcom glucose checking system.  She understands this may not be covered by her insurance and if so she is willing to pay out-of-pocket. A1c and lipids collected today. PNA series: completed. Rpt after 62yo Flu shot: UTD 2023 (recommneded yearly) Foot exam: Completed 11/05/2022 A1c: 7.4>6.9> 7.0> collected today - Hemoglobin A1c - Lipid panel - TSH - Urine Microalbumin w/creat. ratio  Need for Tdap vaccination Tdap administered today Breast cancer screening by mammogram - MM 3D SCREEN BREAST BILATERAL; Future Estrogen deficiency - DG Bone Density; Future  Routine general medical examination at a health care facility - CBC - Comprehensive metabolic panel Patient was encouraged to exercise greater than 150 minutes a week. Patient was encouraged to choose a diet filled with fresh fruits and vegetables, and lean meats. AVS provided to patient today for education/recommendation on gender specific health and safety maintenance. Colonoscopy:cologuard  completed 05/06/2020- rpt 04/2023 Mammogram: completed:UTD 08/20/2022- [facility]- placed order for 2024 Cervical cancer screening: last pap: 2021, results: nl, completed by: PCP- 5 yr Immunizations: tdap UTD today, Influenza UTD 2023 (encouraged yearly), zostavax UTD, covid UTD Infectious disease screening: HIV and Hep C completed DEXA: routine screen  Return in about 24 weeks (around 04/22/2023) for Routine chronic condition follow-up.  Orders Placed This Encounter  Procedures  MM 3D SCREEN BREAST BILATERAL   DG Bone Density   Tdap vaccine greater than or equal to 7yo IM   CBC   Comprehensive metabolic panel   Hemoglobin A1c    Lipid panel   TSH   Urine Microalbumin w/creat. ratio   No orders of the defined types were placed in this encounter.  Referral Orders  No referral(s) requested today     Note is dictated utilizing voice recognition software. Although note has been proof read prior to signing, occasional typographical errors still can be missed. If any questions arise, please do not hesitate to call for verification.  Electronically signed by: Howard Pouch, DO [place]

## 2022-11-05 NOTE — Patient Instructions (Addendum)
Return in about 24 weeks (around 04/22/2023) for Routine chronic condition follow-up.        Great to see you today.  I have refilled the medication(s) we provide.   If labs were collected, we will inform you of lab results once received either by echart message or telephone call.   - echart message- for normal results that have been seen by the patient already.   - telephone call: abnormal results or if patient has not viewed results in their echart.

## 2022-11-06 LAB — LIPID PANEL
Cholesterol: 233 mg/dL — ABNORMAL HIGH (ref <200)
HDL: 65 mg/dL (ref >=50)
LDL Cholesterol (Calc): 147 mg/dL (calc) — ABNORMAL HIGH
Non-HDL Cholesterol (Calc): 168 mg/dL (calc) — ABNORMAL HIGH (ref <130)
Total CHOL/HDL Ratio: 3.6 (calc) (ref <5.0)
Triglycerides: 98 mg/dL (ref <150)

## 2022-11-06 LAB — COMPREHENSIVE METABOLIC PANEL
AG Ratio: 1.4 (calc) (ref 1.0–2.5)
ALT: 23 U/L (ref 6–29)
AST: 21 U/L (ref 10–35)
Albumin: 4.4 g/dL (ref 3.6–5.1)
Alkaline phosphatase (APISO): 83 U/L (ref 37–153)
BUN: 15 mg/dL (ref 7–25)
CO2: 24 mmol/L (ref 20–32)
Calcium: 9.7 mg/dL (ref 8.6–10.4)
Chloride: 107 mmol/L (ref 98–110)
Creat: 0.81 mg/dL (ref 0.50–1.05)
Globulin: 3.1 g/dL (calc) (ref 1.9–3.7)
Glucose, Bld: 111 mg/dL — ABNORMAL HIGH (ref 65–99)
Potassium: 4.3 mmol/L (ref 3.5–5.3)
Sodium: 141 mmol/L (ref 135–146)
Total Bilirubin: 0.4 mg/dL (ref 0.2–1.2)
Total Protein: 7.5 g/dL (ref 6.1–8.1)

## 2022-11-06 LAB — CBC
HCT: 42.5 % (ref 35.0–45.0)
Hemoglobin: 14.1 g/dL (ref 11.7–15.5)
MCH: 29.3 pg (ref 27.0–33.0)
MCHC: 33.2 g/dL (ref 32.0–36.0)
MCV: 88.2 fL (ref 80.0–100.0)
MPV: 10.2 fL (ref 7.5–12.5)
Platelets: 263 10*3/uL (ref 140–400)
RBC: 4.82 10*6/uL (ref 3.80–5.10)
RDW: 13.5 % (ref 11.0–15.0)
WBC: 5.9 10*3/uL (ref 3.8–10.8)

## 2022-11-06 LAB — HEMOGLOBIN A1C
Hgb A1c MFr Bld: 6.5 % of total Hgb — ABNORMAL HIGH (ref <5.7)
Mean Plasma Glucose: 140 mg/dL
eAG (mmol/L): 7.7 mmol/L

## 2022-11-06 LAB — TSH: TSH: 0.71 mIU/L (ref 0.40–4.50)

## 2022-11-08 ENCOUNTER — Telehealth: Payer: Self-pay | Admitting: Family Medicine

## 2022-11-08 NOTE — Telephone Encounter (Signed)
Please call patient Liver, kidney and thyroid function are normal Blood cell counts and electrolytes are normal Diabetes screening/A1c is approved and is now 6.5 Cholesterol panel looks okay.  The good cholesterol is excellent at 65.  The bad cholesterol is higher than desired at 147.  Her LDL goal is at least less than 100, and closer to 70 with her history.  Her overall cholesterol ratio is 3.6, which overall is good.   -A medication would be indicated to help her lower the LDL to goal.  She has declined these in the past, however she has changed her mind and would be happy to call in either Zetia which is a nonstatin medication to help lower her LDL or Lipitor

## 2022-11-08 NOTE — Telephone Encounter (Signed)
Spoke with patient regarding results/recommendations.   Pt will call back with decision

## 2023-04-05 ENCOUNTER — Other Ambulatory Visit: Payer: Self-pay | Admitting: Oncology

## 2023-04-05 DIAGNOSIS — Z006 Encounter for examination for normal comparison and control in clinical research program: Secondary | ICD-10-CM

## 2023-04-06 ENCOUNTER — Other Ambulatory Visit
Admission: RE | Admit: 2023-04-06 | Discharge: 2023-04-06 | Disposition: A | Discharge status: Home / Self Care | Payer: Commercial Managed Care - PPO | Source: Ambulatory Visit | Attending: Oncology | Admitting: Oncology

## 2023-04-06 DIAGNOSIS — Z006 Encounter for examination for normal comparison and control in clinical research program: Secondary | ICD-10-CM | POA: Insufficient documentation

## 2023-04-14 LAB — HM DIABETES EYE EXAM

## 2023-04-29 ENCOUNTER — Ambulatory Visit: Payer: Commercial Managed Care - PPO | Admitting: Family Medicine

## 2023-05-04 ENCOUNTER — Encounter: Payer: Self-pay | Admitting: Ophthalmology

## 2023-05-11 NOTE — Discharge Instructions (Addendum)

## 2023-05-16 ENCOUNTER — Encounter
Admission: RE | Disposition: A | Discharge status: Home / Self Care | Payer: Self-pay | Source: Home / Self Care | Attending: Ophthalmology

## 2023-05-16 ENCOUNTER — Ambulatory Visit
Admission: RE | Admit: 2023-05-16 | Discharge: 2023-05-16 | Disposition: A | Discharge status: Home / Self Care | Payer: Managed Care, Other (non HMO) | Attending: Ophthalmology | Admitting: Ophthalmology

## 2023-05-16 ENCOUNTER — Ambulatory Visit: Payer: Managed Care, Other (non HMO) | Admitting: Anesthesiology

## 2023-05-16 ENCOUNTER — Encounter: Payer: Self-pay | Admitting: Ophthalmology

## 2023-05-16 ENCOUNTER — Other Ambulatory Visit: Payer: Self-pay

## 2023-05-16 DIAGNOSIS — H2511 Age-related nuclear cataract, right eye: Secondary | ICD-10-CM | POA: Insufficient documentation

## 2023-05-16 DIAGNOSIS — R7303 Prediabetes: Secondary | ICD-10-CM | POA: Insufficient documentation

## 2023-05-16 HISTORY — DX: Presence of dental prosthetic device (complete) (partial): Z97.2

## 2023-05-16 HISTORY — PX: CATARACT EXTRACTION W/PHACO: SHX586

## 2023-05-16 LAB — GLUCOSE, CAPILLARY: Glucose-Capillary: 167 mg/dL — ABNORMAL HIGH (ref 70–99)

## 2023-05-16 SURGERY — PHACOEMULSIFICATION, CATARACT, WITH IOL INSERTION
Anesthesia: Monitor Anesthesia Care | Site: Eye | Laterality: Right

## 2023-05-16 MED ORDER — LACTATED RINGERS IV SOLN: INTRAVENOUS | Status: DC

## 2023-05-16 MED ORDER — MIDAZOLAM HCL 2 MG/2ML IJ SOLN
INTRAMUSCULAR | Status: DC | PRN
  Administered 2023-05-16: 2 mg via INTRAVENOUS

## 2023-05-16 MED ORDER — MOXIFLOXACIN HCL 0.5 % OP SOLN
OPHTHALMIC | Status: DC | PRN
  Administered 2023-05-16: .2 mL via OPHTHALMIC

## 2023-05-16 MED ORDER — FENTANYL CITRATE (PF) 100 MCG/2ML IJ SOLN
INTRAMUSCULAR | Status: DC | PRN
  Administered 2023-05-16 (×2): 50 ug via INTRAVENOUS

## 2023-05-16 MED ORDER — ARMC OPHTHALMIC DILATING DROPS
1.0000 | OPHTHALMIC | Status: DC | PRN
  Administered 2023-05-16 (×3): 1 via OPHTHALMIC

## 2023-05-16 MED ORDER — SIGHTPATH DOSE#1 BSS IO SOLN
INTRAOCULAR | Status: DC | PRN
  Administered 2023-05-16: 82 mL via OPHTHALMIC

## 2023-05-16 MED ORDER — LIDOCAINE HCL (PF) 2 % IJ SOLN
INTRAOCULAR | Status: DC | PRN
  Administered 2023-05-16: 1 mL via INTRAOCULAR

## 2023-05-16 MED ORDER — SIGHTPATH DOSE#1 NA HYALUR & NA CHOND-NA HYALUR IO KIT
PACK | INTRAOCULAR | Status: DC | PRN
  Administered 2023-05-16: 1 via OPHTHALMIC

## 2023-05-16 MED ORDER — TETRACAINE HCL 0.5 % OP SOLN
1.0000 [drp] | OPHTHALMIC | Status: DC | PRN
  Administered 2023-05-16 (×3): 1 [drp] via OPHTHALMIC

## 2023-05-16 MED ORDER — SIGHTPATH DOSE#1 BSS IO SOLN
INTRAOCULAR | Status: DC | PRN
  Administered 2023-05-16: 15 mL

## 2023-05-16 SURGICAL SUPPLY — 13 items
CATARACT SUITE SIGHTPATH (MISCELLANEOUS) ×1
DISSECTOR HYDRO NUCLEUS 50X22 (MISCELLANEOUS) ×1 IMPLANT
FEE CATARACT SUITE SIGHTPATH (MISCELLANEOUS) ×1 IMPLANT
GLOVE SURG GAMMEX PI TX LF 7.5 (GLOVE) ×1 IMPLANT
GLOVE SURG SYN 8.5 E (GLOVE) ×1
GLOVE SURG SYN 8.5 PF PI (GLOVE) ×1 IMPLANT
LENS CLAREON VIVITY 14.5 ×1 IMPLANT
LENS CLRN VIVITY 14.5 ×1 IMPLANT
LENS IOL CLRN VT YLW 14.5 IMPLANT
NDL FILTER BLUNT 18X1 1/2 (NEEDLE) ×1 IMPLANT
NEEDLE FILTER BLUNT 18X1 1/2 (NEEDLE) ×1
SYR 3ML LL SCALE MARK (SYRINGE) ×1 IMPLANT
SYR 5ML LL (SYRINGE) ×1 IMPLANT

## 2023-05-16 NOTE — Transfer of Care (Signed)
Immediate Anesthesia Transfer of Care Note  Patient: Karla Chapman  Procedure(s) Performed: CATARACT EXTRACTION PHACO AND INTRAOCULAR LENS PLACEMENT (IOC) RIGHT DIABETIC CLAREON VIVITY  4.43  00:30.4 (Right: Eye)  Patient Location: PACU  Anesthesia Type: MAC  Level of Consciousness: awake, alert  and patient cooperative  Airway and Oxygen Therapy: Patient Spontanous Breathing and Patient connected to supplemental oxygen  Post-op Assessment: Post-op Vital signs reviewed, Patient's Cardiovascular Status Stable, Respiratory Function Stable, Patent Airway and No signs of Nausea or vomiting  Post-op Vital Signs: Reviewed and stable  Complications: No notable events documented.

## 2023-05-16 NOTE — H&P (Signed)
[facility]   Primary Care Physician:  Patient, No Pcp Per Ophthalmologist: Dr. Willey Blade  Pre-Procedure History & Physical: HPI:  RHEMA LOYAL is a 63 y.o. female here for cataract surgery.   Past Medical History:  Diagnosis Date   Diabetes (HCC)    Pre-diabetic   Hyperlipidemia    Wears dentures    partial upper    Past Surgical History:  Procedure Laterality Date   NO PAST SURGERIES      Prior to Admission medications   Medication Sig Start Date End Date Taking? Authorizing Provider  Berberine Chloride (BERBERINE HCI PO) Take 1,200 mg by mouth 2 (two) times daily.   Yes [provider]  Cholecalciferol (VITAMIN D3) 125 MCG (5000 UT) TABS Take by mouth daily. With 100 mg K2   Yes [provider]  Garlic (GARLIQUE PO) Take by mouth.   Yes [provider]  MAGNESIUM CITRATE PO Take by mouth daily.   Yes [provider]  Multiple Vitamin (MULTIVITAMIN) capsule Take 1 capsule by mouth daily.   Yes [provider]  Omega-3 Fatty Acids (FISH OIL) 1000 MG CPDR Take by mouth.   Yes [provider]    Allergies as of 04/18/2023 - Review Complete 11/05/2022  Allergen Reaction Noted   Penicillins Rash 04/12/2017    Family History  Problem Relation Age of Onset   Cancer Mother    Diabetes Mother    Heart disease Mother 44   Heart attack Mother    Alcohol abuse Father    Early death Father 84   Cancer Brother    Alcohol abuse Brother    Diabetes Brother    Cancer Maternal Aunt    Dementia Maternal Aunt     Social History   Socioeconomic History   Marital status: Married    Spouse name: Not on file   Number of children: 0   Years of education: Not on file   Highest education level: Master's degree (e.g., MA, MS, MEng, MEd, MSW, MBA)  Occupational History   Occupation: IT    Employer: [employer]  Tobacco Use   Smoking status: Never    Passive exposure: Never   Smokeless tobacco: Never   Vaping Use   Vaping status: Never Used  Substance and Sexual Activity   Alcohol use: Yes    Comment: 1-2 glasses of beer, or wine weekly    Drug use: No   Sexual activity: Not Currently    Partners: Male  Other Topics Concern   Not on file  Social History Narrative   Marital status/children/pets: married, G0P0   Education/employment: Education administrator, Gaffer:      -smoke alarm in the home:Yes     - wears seatbelt: Yes     - Feels safe in their relationships: Yes      Social Determinants of Health   Financial Resource Strain: Low Risk  (03/18/2022)   Overall Financial Resource Strain (CARDIA)    Difficulty of Paying Living Expenses: Not hard at all  Food Insecurity: Unknown (03/18/2022)   Hunger Vital Sign    Worried About Running Out of Food in the Last Year: Not on file    Ran Out of Food in the Last Year: Never true  Transportation Needs: No Transportation Needs (03/18/2022)   PRAPARE - Administrator, Civil Service (Medical): No    Lack of Transportation (Non-Medical): No  Physical Activity: Sufficiently Active (03/18/2022)   Exercise Vital  Sign    Days of Exercise per Week: 5 days    Minutes of Exercise per Session: 30 min  Stress: Stress Concern Present (03/18/2022)   [employer] of Occupational Health - Occupational Stress Questionnaire    Feeling of Stress : To some extent  Social Connections: Moderately Isolated (03/18/2022)   Social Connection and Isolation Panel [NHANES]    Frequency of Communication with Friends and Family: More than three times a week    Frequency of Social Gatherings with Friends and Family: Never    Attends Religious Services: Never    Database administrator or Organizations: No    Attends Engineer, structural: Not on file    Marital Status: Married  Catering manager Violence: Not on file    Review of Systems: See HPI, otherwise negative ROS  Physical Exam: BP (!) 163/87   Pulse 71   Temp (!) 97.2 F  (36.2 C) (Temporal)   Resp 18   Ht 5' 7.01" (1.702 m)   Wt 112.7 kg   SpO2 95%   BMI 38.91 kg/m  General:   Alert, cooperative in NAD Head:  Normocephalic and atraumatic. Respiratory:  Normal work of breathing. Cardiovascular:  RRR  Impression/Plan: ENOLIA CRUZAN is here for cataract surgery.  Risks, benefits, limitations, and alternatives regarding cataract surgery have been reviewed with the patient.  Questions have been answered.  All parties agreeable.   Willey Blade, MD  05/16/2023, 7:24 AM

## 2023-05-16 NOTE — Op Note (Signed)
OPERATIVE NOTE  [name] [id number] 05/16/2023   PREOPERATIVE DIAGNOSIS:  Nuclear sclerotic cataract right eye.  H25.11   POSTOPERATIVE DIAGNOSIS:    Nuclear sclerotic cataract right eye.     PROCEDURE:  Phacoemusification with posterior chamber intraocular lens placement of the right eye   LENS:  * No implants in log *     Procedure(s): CATARACT EXTRACTION PHACO AND INTRAOCULAR LENS PLACEMENT (IOC) RIGHT DIABETIC CLAREON VIVITY  4.43  00:30.4 (Right)  CNWET0 +14.5   ULTRASOUND TIME: 0 minutes 30 seconds.  CDE 4.43   SURGEON:  Willey Blade, MD, MPH  ANESTHESIOLOGIST: Anesthesiologist: Lenard Simmer, MD CRNA: Barbette Hair, CRNA   ANESTHESIA:  Topical with tetracaine drops augmented with 1% preservative-free intracameral lidocaine.  ESTIMATED BLOOD LOSS: less than 1 mL.   COMPLICATIONS:  None.   DESCRIPTION OF PROCEDURE:  The patient was identified in the holding room and transported to the operating room and placed in the supine position under the operating microscope.  The right eye was identified as the operative eye and it was prepped and draped in the usual sterile ophthalmic fashion.   A 1.0 millimeter clear-corneal paracentesis was made at the 10:30 position. 0.5 ml of preservative-free 1% lidocaine with epinephrine was injected into the anterior chamber.  The anterior chamber was filled with viscoelastic.  A 2.4 millimeter keratome was used to make a near-clear corneal incision at the 8:00 position.  A curvilinear capsulorrhexis was made with a cystotome and capsulorrhexis forceps.  Balanced salt solution was used to hydrodissect and hydrodelineate the nucleus.   Phacoemulsification was then used in stop and chop fashion to remove the lens nucleus and epinucleus.  The remaining cortex was then removed using the irrigation and aspiration handpiece.   There was a small amount of fibrotic posterior subcaspular opacification that could not be safely polished  off.  Viscoelastic was then placed into the capsular bag to distend it for lens placement.  A lens was then injected into the capsular bag.  The remaining viscoelastic was aspirated.   Wounds were hydrated with balanced salt solution.  The anterior chamber was inflated to a physiologic pressure with balanced salt solution.    Intracameral vigamox 0.1 mL undiluted was injected into the eye and a drop placed onto the ocular surface.  No wound leaks were noted.  The patient was taken to the recovery room in stable condition without complications of anesthesia or surgery  Willey Blade 05/16/2023, 7:56 AM 2

## 2023-05-16 NOTE — Anesthesia Postprocedure Evaluation (Signed)
Anesthesia Post Note  Patient: Karla Chapman  Procedure(s) Performed: CATARACT EXTRACTION PHACO AND INTRAOCULAR LENS PLACEMENT (IOC) RIGHT DIABETIC CLAREON VIVITY  4.43  00:30.4 (Right: Eye)  Patient location during evaluation: PACU Anesthesia Type: MAC Level of consciousness: awake and alert Pain management: pain level controlled Vital Signs Assessment: post-procedure vital signs reviewed and stable Respiratory status: spontaneous breathing, nonlabored ventilation, respiratory function stable and patient connected to nasal cannula oxygen Cardiovascular status: stable and blood pressure returned to baseline Postop Assessment: no apparent nausea or vomiting Anesthetic complications: no   No notable events documented.   Last Vitals:  Vitals:   05/16/23 0756 05/16/23 0800  BP: (!) 169/99 (!) 173/96  Pulse: 68 (!) 58  Resp: 12 11  Temp: 36.5 C 36.5 C  SpO2: 96% 95%    Last Pain:  Vitals:   05/16/23 0800  TempSrc:   PainSc: 0-No pain                 Lenard Simmer

## 2023-05-16 NOTE — Anesthesia Preprocedure Evaluation (Signed)
Anesthesia Evaluation  Patient identified by MRN, date of birth, ID band Patient awake    Reviewed: Allergy & Precautions, H&P , NPO status , Patient's Chart, lab work & pertinent test results, reviewed documented beta blocker date and time   History of Anesthesia Complications Negative for: history of anesthetic complications  Airway Mallampati: III  TM Distance: >3 FB Neck ROM: full    Dental  (+) Partial Upper, Dental Advidsory Given, Missing   Pulmonary neg shortness of breath, sleep apnea , neg COPD, neg recent URI   Pulmonary exam normal breath sounds clear to auscultation       Cardiovascular Exercise Tolerance: Good negative cardio ROS Normal cardiovascular exam Rhythm:regular Rate:Normal     Neuro/Psych negative neurological ROS  negative psych ROS   GI/Hepatic negative GI ROS, Neg liver ROS,,,  Endo/Other  diabetes (borderline)    Renal/GU negative Renal ROS  negative genitourinary   Musculoskeletal   Abdominal   Peds  Hematology negative hematology ROS (+)   Anesthesia Other Findings Past Medical History: No date: Diabetes (HCC)     Comment:  Pre-diabetic No date: Hyperlipidemia No date: Wears dentures     Comment:  partial upper   Reproductive/Obstetrics negative OB ROS                             Anesthesia Physical Anesthesia Plan  ASA: 2  Anesthesia Plan: MAC   Post-op Pain Management:    Induction: Intravenous  PONV Risk Score and Plan: Midazolam and Treatment may vary due to age or medical condition  Airway Management Planned: Natural Airway and Nasal Cannula  Additional Equipment:   Intra-op Plan:   Post-operative Plan:   Informed Consent: I have reviewed the patients History and Physical, chart, labs and discussed the procedure including the risks, benefits and alternatives for the proposed anesthesia with the patient or authorized representative who  has indicated his/her understanding and acceptance.     Dental Advisory Given  Plan Discussed with: Anesthesiologist, CRNA and Surgeon  Anesthesia Plan Comments:         Anesthesia Quick Evaluation

## 2023-05-17 ENCOUNTER — Encounter: Payer: Self-pay | Admitting: Ophthalmology

## 2023-05-17 NOTE — Anesthesia Preprocedure Evaluation (Addendum)
Anesthesia Evaluation  Patient identified by MRN, date of birth, ID band Patient awake    Reviewed: Allergy & Precautions, H&P , NPO status , Patient's Chart, lab work & pertinent test results  Airway Mallampati: III  TM Distance: >3 FB Neck ROM: Full    Dental no notable dental hx. (+) Partial Upper   Pulmonary neg pulmonary ROS, sleep apnea    Pulmonary exam normal breath sounds clear to auscultation       Cardiovascular negative cardio ROS Normal cardiovascular exam Rhythm:Regular Rate:Normal     Neuro/Psych negative neurological ROS  negative psych ROS   GI/Hepatic negative GI ROS, Neg liver ROS,,,  Endo/Other  negative endocrine ROSdiabetes    Renal/GU negative Renal ROS  negative genitourinary   Musculoskeletal negative musculoskeletal ROS (+)    Abdominal   Peds negative pediatric ROS (+)  Hematology negative hematology ROS (+)   Anesthesia Other Findings Sleep apnea Diabetes Hyperlipidemia Previous cataract surgery Dr. Karlton Lemon anesthesiologist 05-16-23  Reproductive/Obstetrics negative OB ROS                             Anesthesia Physical Anesthesia Plan  ASA: 2  Anesthesia Plan: MAC   Post-op Pain Management:    Induction: Intravenous  PONV Risk Score and Plan:   Airway Management Planned: Natural Airway and Nasal Cannula  Additional Equipment:   Intra-op Plan:   Post-operative Plan:   Informed Consent: I have reviewed the patients History and Physical, chart, labs and discussed the procedure including the risks, benefits and alternatives for the proposed anesthesia with the patient or authorized representative who has indicated his/her understanding and acceptance.     Dental Advisory Given  Plan Discussed with: Anesthesiologist, CRNA and Surgeon  Anesthesia Plan Comments: (Patient consented for risks of anesthesia including but not limited to:  - adverse  reactions to medications - damage to eyes, teeth, lips or other oral mucosa - nerve damage due to positioning  - sore throat or hoarseness - Damage to heart, brain, nerves, lungs, other parts of body or loss of life  Patient voiced understanding.)        Anesthesia Quick Evaluation

## 2023-05-18 ENCOUNTER — Encounter: Payer: Self-pay | Admitting: Ophthalmology

## 2023-05-23 LAB — HELIX MOLECULAR SCREEN

## 2023-05-27 NOTE — Discharge Instructions (Signed)

## 2023-05-30 ENCOUNTER — Encounter: Payer: Self-pay | Admitting: Ophthalmology

## 2023-05-30 ENCOUNTER — Encounter
Admission: RE | Disposition: A | Discharge status: Home / Self Care | Payer: Self-pay | Source: Home / Self Care | Attending: Ophthalmology

## 2023-05-30 ENCOUNTER — Ambulatory Visit: Payer: Managed Care, Other (non HMO) | Admitting: Anesthesiology

## 2023-05-30 ENCOUNTER — Other Ambulatory Visit: Payer: Self-pay

## 2023-05-30 ENCOUNTER — Ambulatory Visit
Admission: RE | Admit: 2023-05-30 | Discharge: 2023-05-30 | Disposition: A | Discharge status: Home / Self Care | Payer: Managed Care, Other (non HMO) | Attending: Ophthalmology | Admitting: Ophthalmology

## 2023-05-30 DIAGNOSIS — Z833 Family history of diabetes mellitus: Secondary | ICD-10-CM | POA: Insufficient documentation

## 2023-05-30 DIAGNOSIS — H2512 Age-related nuclear cataract, left eye: Secondary | ICD-10-CM | POA: Diagnosis present

## 2023-05-30 DIAGNOSIS — G4730 Sleep apnea, unspecified: Secondary | ICD-10-CM | POA: Diagnosis not present

## 2023-05-30 DIAGNOSIS — R7303 Prediabetes: Secondary | ICD-10-CM | POA: Insufficient documentation

## 2023-05-30 DIAGNOSIS — E785 Hyperlipidemia, unspecified: Secondary | ICD-10-CM | POA: Insufficient documentation

## 2023-05-30 HISTORY — PX: CATARACT EXTRACTION W/PHACO: SHX586

## 2023-05-30 HISTORY — DX: Sleep apnea, unspecified: G47.30

## 2023-05-30 LAB — GLUCOSE, CAPILLARY: Glucose-Capillary: 161 mg/dL — ABNORMAL HIGH (ref 70–99)

## 2023-05-30 SURGERY — PHACOEMULSIFICATION, CATARACT, WITH IOL INSERTION
Anesthesia: Monitor Anesthesia Care | Site: Eye | Laterality: Left

## 2023-05-30 MED ORDER — TETRACAINE HCL 0.5 % OP SOLN
OPHTHALMIC | Status: AC
  Filled 2023-05-30: qty 4

## 2023-05-30 MED ORDER — SIGHTPATH DOSE#1 BSS IO SOLN
INTRAOCULAR | Status: DC | PRN
  Administered 2023-05-30: 15 mL

## 2023-05-30 MED ORDER — MIDAZOLAM HCL 2 MG/2ML IJ SOLN
INTRAMUSCULAR | Status: DC | PRN
  Administered 2023-05-30: 2 mg via INTRAVENOUS

## 2023-05-30 MED ORDER — LACTATED RINGERS IV SOLN: INTRAVENOUS | Status: DC

## 2023-05-30 MED ORDER — SIGHTPATH DOSE#1 NA HYALUR & NA CHOND-NA HYALUR IO KIT
PACK | INTRAOCULAR | Status: DC | PRN
  Administered 2023-05-30: 1 via OPHTHALMIC

## 2023-05-30 MED ORDER — ARMC OPHTHALMIC DILATING DROPS
1.0000 | OPHTHALMIC | Status: DC | PRN
  Administered 2023-05-30 (×3): 1 via OPHTHALMIC

## 2023-05-30 MED ORDER — TETRACAINE HCL 0.5 % OP SOLN
1.0000 [drp] | OPHTHALMIC | Status: DC | PRN
  Administered 2023-05-30 (×3): 1 [drp] via OPHTHALMIC

## 2023-05-30 MED ORDER — FENTANYL CITRATE (PF) 100 MCG/2ML IJ SOLN
INTRAMUSCULAR | Status: AC
  Filled 2023-05-30: qty 2

## 2023-05-30 MED ORDER — MIDAZOLAM HCL 2 MG/2ML IJ SOLN
INTRAMUSCULAR | Status: AC
  Filled 2023-05-30: qty 2

## 2023-05-30 MED ORDER — MOXIFLOXACIN HCL 0.5 % OP SOLN
OPHTHALMIC | Status: DC | PRN
  Administered 2023-05-30: .2 mL via OPHTHALMIC

## 2023-05-30 MED ORDER — LIDOCAINE HCL (PF) 2 % IJ SOLN
INTRAOCULAR | Status: DC | PRN
  Administered 2023-05-30: 1 mL via INTRAOCULAR

## 2023-05-30 MED ORDER — FENTANYL CITRATE (PF) 100 MCG/2ML IJ SOLN
INTRAMUSCULAR | Status: DC | PRN
  Administered 2023-05-30 (×2): 50 ug via INTRAVENOUS

## 2023-05-30 MED ORDER — SIGHTPATH DOSE#1 BSS IO SOLN
INTRAOCULAR | Status: DC | PRN
  Administered 2023-05-30: 113 mL via OPHTHALMIC

## 2023-05-30 SURGICAL SUPPLY — 13 items
CATARACT SUITE SIGHTPATH (MISCELLANEOUS) ×1
DISSECTOR HYDRO NUCLEUS 50X22 (MISCELLANEOUS) ×1 IMPLANT
FEE CATARACT SUITE SIGHTPATH (MISCELLANEOUS) ×1 IMPLANT
GLOVE SURG GAMMEX PI TX LF 7.5 (GLOVE) ×1 IMPLANT
GLOVE SURG SYN 8.5 E (GLOVE) ×1
GLOVE SURG SYN 8.5 PF PI (GLOVE) ×1 IMPLANT
LENS CLAREON VIVITY TORIC 14.0 ×1 IMPLANT
LENS CLRN VIVITY TORIC 3 14.0 ×1 IMPLANT
LENS IOL CLRN VT TRC 3 14.0 IMPLANT
NDL FILTER BLUNT 18X1 1/2 (NEEDLE) ×1 IMPLANT
NEEDLE FILTER BLUNT 18X1 1/2 (NEEDLE) ×1
SYR 3ML LL SCALE MARK (SYRINGE) ×1 IMPLANT
SYR 5ML LL (SYRINGE) ×1 IMPLANT

## 2023-05-30 NOTE — H&P (Signed)
[facility]   Primary Care Physician:  Patient, No Pcp Per Ophthalmologist: Dr. Willey Blade  Pre-Procedure History & Physical: HPI:  Karla Chapman is a 62 y.o. female here for cataract surgery.   Past Medical History:  Diagnosis Date   Diabetes (HCC)    Pre-diabetic   Hyperlipidemia    Sleep apnea    Wears dentures    partial upper    Past Surgical History:  Procedure Laterality Date   CATARACT EXTRACTION W/PHACO Right 05/16/2023   Procedure: CATARACT EXTRACTION PHACO AND INTRAOCULAR LENS PLACEMENT (IOC) RIGHT DIABETIC CLAREON VIVITY  4.43  00:30.4;  Surgeon: Nevada Crane, MD;  Location: [facility];  Service: Ophthalmology;  Laterality: Right;   NO PAST SURGERIES      Prior to Admission medications   Medication Sig Start Date End Date Taking? Authorizing Provider  Berberine Chloride (BERBERINE HCI PO) Take 1,200 mg by mouth 2 (two) times daily.   Yes [provider]  Cholecalciferol (VITAMIN D3) 125 MCG (5000 UT) TABS Take by mouth daily. With 100 mg K2   Yes [provider]  Garlic (GARLIQUE PO) Take by mouth.   Yes [provider]  MAGNESIUM CITRATE PO Take by mouth daily.   Yes [provider]  Multiple Vitamin (MULTIVITAMIN) capsule Take 1 capsule by mouth daily.   Yes [provider]  Omega-3 Fatty Acids (FISH OIL) 1000 MG CPDR Take by mouth.   Yes [provider]    Allergies as of 04/18/2023 - Review Complete 11/05/2022  Allergen Reaction Noted   Penicillins Rash 04/12/2017    Family History  Problem Relation Age of Onset   Cancer Mother    Diabetes Mother    Heart disease Mother 97   Heart attack Mother    Alcohol abuse Father    Early death Father 62   Cancer Brother    Alcohol abuse Brother    Diabetes Brother    Cancer Maternal Aunt    Dementia Maternal Aunt     Social History   Socioeconomic History   Marital status: Married    Spouse name: Not on file   Number  of children: 0   Years of education: Not on file   Highest education level: Master's degree (e.g., MA, MS, MEng, MEd, MSW, MBA)  Occupational History   Occupation: IT    Employer: [employer]  Tobacco Use   Smoking status: Never    Passive exposure: Never   Smokeless tobacco: Never  Vaping Use   Vaping status: Never Used  Substance and Sexual Activity   Alcohol use: Yes    Comment: 1-2 glasses of beer, or wine weekly    Drug use: No   Sexual activity: Not Currently    Partners: Male  Other Topics Concern   Not on file  Social History Narrative   Marital status/children/pets: married, G0P0   Education/employment: Education administrator, Gaffer:      -smoke alarm in the home:Yes     - wears seatbelt: Yes     - Feels safe in their relationships: Yes      Social Determinants of Health   Financial Resource Strain: Low Risk  (03/18/2022)   Overall Financial Resource Strain (CARDIA)    Difficulty of Paying Living Expenses: Not hard at all  Food Insecurity: Unknown (03/18/2022)   Hunger Vital Sign    Worried About Running Out of Food in the Last Year: Not on file  Ran Out of Food in the Last Year: Never true  Transportation Needs: No Transportation Needs (03/18/2022)   PRAPARE - Administrator, Civil Service (Medical): No    Lack of Transportation (Non-Medical): No  Physical Activity: Sufficiently Active (03/18/2022)   Exercise Vital Sign    Days of Exercise per Week: 5 days    Minutes of Exercise per Session: 30 min  Stress: Stress Concern Present (03/18/2022)   [employer] of Occupational Health - Occupational Stress Questionnaire    Feeling of Stress : To some extent  Social Connections: Moderately Isolated (03/18/2022)   Social Connection and Isolation Panel [NHANES]    Frequency of Communication with Friends and Family: More than three times a week    Frequency of Social Gatherings with Friends and Family: Never    Attends Religious Services: Never     Database administrator or Organizations: No    Attends Engineer, structural: Not on file    Marital Status: Married  Catering manager Violence: Not on file    Review of Systems: See HPI, otherwise negative ROS  Physical Exam: BP (!) 140/73   Pulse 75   Temp (!) 97.2 F (36.2 C)   Resp 16   Ht 5' 7.01" (1.702 m)   Wt 112.4 kg   SpO2 96%   BMI 38.82 kg/m  General:   Alert, cooperative in NAD Head:  Normocephalic and atraumatic. Respiratory:  Normal work of breathing. Cardiovascular:  RRR  Impression/Plan: Karla Chapman is here for cataract surgery.  Risks, benefits, limitations, and alternatives regarding cataract surgery have been reviewed with the patient.  Questions have been answered.  All parties agreeable.   Willey Blade, MD  05/30/2023, 7:09 AM

## 2023-05-30 NOTE — Anesthesia Postprocedure Evaluation (Signed)
Anesthesia Post Note  Patient: Karla Chapman  Procedure(s) Performed: CATARACT EXTRACTION PHACO AND INTRAOCULAR LENS PLACEMENT (IOC) LEFT DIABETIC CLAREON VIVITY TORIC  2.21  00:22.6 (Left: Eye)  Anesthesia Type: MAC Anesthetic complications: no   No notable events documented.   Last Vitals:  Vitals:   05/30/23 0754 05/30/23 0759  BP: (!) 142/77 (!) 146/83  Pulse: 68 67  Resp: 18   Temp: (!) 36.3 C (!) 36.4 C  SpO2: 95% 99%    Last Pain:  Vitals:   05/30/23 0759  PainSc: 0-No pain                 Heman Que C Lyonel Morejon

## 2023-05-30 NOTE — Op Note (Signed)
OPERATIVE NOTE  Karla Chapman [id number] 05/30/2023   PREOPERATIVE DIAGNOSIS:  Nuclear sclerotic cataract left eye.  H25.12   POSTOPERATIVE DIAGNOSIS:    Nuclear sclerotic cataract left eye.     PROCEDURE:  Phacoemusification with posterior chamber intraocular lens placement of the left eye   LENS:   Implant Name Type Inv. Item Serial No. Manufacturer Lot No. LRB No. Used Action  LENS CLAREON VIVITY TORIC 14.0 - [id number]  LENS CLAREON VIVITY TORIC 14.0 [id number] [employer]IGHT[employer]  Left 1 Implanted      Procedure(s): CATARACT EXTRACTION PHACO AND INTRAOCULAR LENS PLACEMENT (IOC) LEFT DIABETIC CLAREON VIVITY TORIC  2.21  00:22.6 (Left)  [id number] +14.0   ULTRASOUND TIME: 0 minutes 22 seconds.  CDE 2.21   SURGEON:  Willey Blade, MD, MPH   ANESTHESIA:  Topical with tetracaine drops augmented with 1% preservative-free intracameral lidocaine.  ESTIMATED BLOOD LOSS: <1 mL   COMPLICATIONS:  None.   DESCRIPTION OF PROCEDURE:  The patient was identified in the holding room and transported to the operating room and placed in the supine position under the operating microscope.  The left eye was identified as the operative eye and it was prepped and draped in the usual sterile ophthalmic fashion.  The verion system was registered without difficulty.   A 1.0 millimeter clear-corneal paracentesis was made at the 5:00 position. 0.5 ml of preservative-free 1% lidocaine with epinephrine was injected into the anterior chamber.  The anterior chamber was filled with viscoelastic.  A 2.4 millimeter keratome was used to make a near-clear corneal incision at the 2:00 position.  A curvilinear capsulorrhexis was made with a cystotome and capsulorrhexis forceps.  Balanced salt solution was used to hydrodissect and hydrodelineate the nucleus.   Phacoemulsification was then used in stop and chop fashion to remove the lens nucleus and epinucleus.  The remaining cortex was then removed using the irrigation  and aspiration handpiece. Viscoelastic was then placed into the capsular bag to distend it for lens placement.  A lens was then injected into the capsular bag.  The remaining viscoelastic was aspirated.  The lens was rotated with guidance from the verion system.   Wounds were hydrated with balanced salt solution.  The anterior chamber was inflated to a physiologic pressure with balanced salt solution.  Intracameral vigamox 0.1 mL undiltued was injected into the eye and a drop placed onto the ocular surface.  No wound leaks were noted.  The patient was taken to the recovery room in stable condition without complications of anesthesia or surgery  Willey Blade 05/30/2023, 7:53 AM

## 2023-05-30 NOTE — Transfer of Care (Signed)
Immediate Anesthesia Transfer of Care Note  Patient: Karla Chapman  Procedure(s) Performed: CATARACT EXTRACTION PHACO AND INTRAOCULAR LENS PLACEMENT (IOC) LEFT DIABETIC CLAREON VIVITY TORIC  2.21  00:22.6 (Left: Eye)  Patient Location: PACU  Anesthesia Type: MAC  Level of Consciousness: awake, alert  and patient cooperative  Airway and Oxygen Therapy: Patient Spontanous Breathing and Patient connected to supplemental oxygen  Post-op Assessment: Post-op Vital signs reviewed, Patient's Cardiovascular Status Stable, Respiratory Function Stable, Patent Airway and No signs of Nausea or vomiting  Post-op Vital Signs: Reviewed and stable  Complications: No notable events documented.

## 2023-08-26 ENCOUNTER — Ambulatory Visit
Admission: RE | Admit: 2023-08-26 | Discharge: 2023-08-26 | Disposition: A | Discharge status: Home / Self Care | Payer: Managed Care, Other (non HMO) | Source: Ambulatory Visit | Attending: Family Medicine | Admitting: Family Medicine

## 2023-08-26 DIAGNOSIS — Z1231 Encounter for screening mammogram for malignant neoplasm of breast: Secondary | ICD-10-CM

## 2023-08-26 DIAGNOSIS — E2839 Other primary ovarian failure: Secondary | ICD-10-CM

## 2023-12-07 ENCOUNTER — Encounter: Payer: Self-pay | Admitting: Emergency Medicine

## 2023-12-07 ENCOUNTER — Ambulatory Visit
Admission: EM | Admit: 2023-12-07 | Discharge: 2023-12-07 | Disposition: A | Discharge status: Home / Self Care | Attending: Family Medicine | Admitting: Family Medicine

## 2023-12-07 DIAGNOSIS — L241 Irritant contact dermatitis due to oils and greases: Secondary | ICD-10-CM

## 2023-12-07 MED ORDER — TRIAMCINOLONE ACETONIDE 0.1 % EX OINT: 1.0000 | TOPICAL_OINTMENT | Freq: Two times a day (BID) | CUTANEOUS | 0 refills | Status: AC

## 2023-12-07 MED ORDER — DEXAMETHASONE SODIUM PHOSPHATE 10 MG/ML IJ SOLN
10.0000 mg | Freq: Once | INTRAMUSCULAR | Status: AC
  Administered 2023-12-07: 10 mg via INTRAMUSCULAR

## 2023-12-07 MED ORDER — PREDNISONE 10 MG (21) PO TBPK: ORAL_TABLET | Freq: Every day | ORAL | 0 refills | Status: AC

## 2023-12-07 NOTE — ED Provider Notes (Signed)
 [facility]    CSN: [id number] Arrival date & time: 12/07/23  1318      History   Chief Complaint Chief Complaint  Patient presents with   Rash    HPI Karla Chapman is a 63 y.o. female.   HPI  Karla Chapman presents for bilateral arm and leg rash for the past 3 days. Rash is very itchy.  Started on the legs then it spread to other areas.  She recently returned from a trip [place].  They stayed in an AirBNB but her husband is not itching.  She went to a cemetery/garden and  brushed up against some plants.  Has been putting calamine lotion on it without relief.    Past Medical History:  Diagnosis Date   Diabetes (HCC)    Pre-diabetic   Hyperlipidemia    Sleep apnea    Wears dentures    partial upper    Patient Active Problem List   Diagnosis Date Noted   Refusal of diabetes medication 10/28/2021   Refusal of statin medication by patient 10/28/2021   Morbid obesity (HCC) 10/26/2021   BMI 35-39.9, adult 10/26/2021   Type 2 diabetes mellitus with hyperlipidemia (HCC) 04/19/2019    Past Surgical History:  Procedure Laterality Date   CATARACT EXTRACTION W/PHACO Right 05/16/2023   Procedure: CATARACT EXTRACTION PHACO AND INTRAOCULAR LENS PLACEMENT (IOC) RIGHT DIABETIC CLAREON VIVITY  4.43  00:30.4;  Surgeon: Nevada Crane, MD;  Location: [facility];  Service: Ophthalmology;  Laterality: Right;   CATARACT EXTRACTION W/PHACO Left 05/30/2023   Procedure: CATARACT EXTRACTION PHACO AND INTRAOCULAR LENS PLACEMENT (IOC) LEFT DIABETIC CLAREON VIVITY TORIC  2.21  00:22.6;  Surgeon: Nevada Crane, MD;  Location: [facility];  Service: Ophthalmology;  Laterality: Left;   NO PAST SURGERIES      OB History     Gravida  0   Para  0   Term  0   Preterm  0   AB  0   Living  0      SAB  0   IAB  0   Ectopic  0   Multiple  0   Live Births  0            Home Medications    Prior to Admission medications    Medication Sig Start Date End Date Taking? Authorizing Provider  predniSONE (STERAPRED UNI-PAK 21 TAB) 10 MG (21) TBPK tablet Take by mouth daily. Take 6 tabs by mouth daily for 1, then 5 tabs for 1 day, then 4 tabs for 1 day, then 3 tabs for 1 day, then 2 tabs for 1 day, then 1 tab for 1 day. 12/07/23  Yes Khup Sapia, DO  triamcinolone ointment (KENALOG) 0.1 % Apply 1 Application topically 2 (two) times daily. 12/07/23  Yes Eoghan Belcher, DO  Berberine Chloride (BERBERINE HCI PO) Take 1,200 mg by mouth 2 (two) times daily.    [provider]  Cholecalciferol (VITAMIN D3) 125 MCG (5000 UT) TABS Take by mouth daily. With 100 mg K2    [provider]  Garlic (GARLIQUE PO) Take by mouth.    [provider]  MAGNESIUM CITRATE PO Take by mouth daily.    [provider]  Multiple Vitamin (MULTIVITAMIN) capsule Take 1 capsule by mouth daily.    [provider]  Omega-3 Fatty Acids (FISH OIL) 1000 MG CPDR Take by mouth.    [provider]    [facility]  History Family History  Problem Relation Age of Onset   Cancer Mother    Diabetes Mother    Heart disease Mother 70   Heart attack Mother    Alcohol abuse Father    Early death Father 6   Cancer Brother    Alcohol abuse Brother    Diabetes Brother    Cancer Maternal Aunt    Dementia Maternal Aunt     Social History Social History   Tobacco Use   Smoking status: Never    Passive exposure: Never   Smokeless tobacco: Never  Vaping Use   Vaping status: Never Used  Substance Use Topics   Alcohol use: Yes    Comment: 1-2 glasses of beer, or wine weekly    Drug use: No     Allergies   Neosporin [bacitracin-polymyxin b] and Penicillins   Review of Systems Review of Systems :negative unless otherwise stated in HPI.      Physical Exam Triage Vital Signs ED Triage Vitals  Encounter Vitals Group     BP 12/07/23 1355 138/83     Systolic BP Percentile --      Diastolic BP  Percentile --      Pulse Rate 12/07/23 1355 79     Resp 12/07/23 1355 16     Temp 12/07/23 1355 98.7 F (37.1 C)     Temp Source 12/07/23 1355 Oral     SpO2 12/07/23 1355 95 %     Weight --      Height --      Head Circumference --      Peak Flow --      Pain Score 12/07/23 1352 0     Pain Loc --      Pain Education --      Exclude from Growth Chart --    No data found.  Updated Vital Signs BP 138/83 (BP Location: Left Arm)   Pulse 79   Temp 98.7 F (37.1 C) (Oral)   Resp 16   SpO2 95%   Visual Acuity Right Eye Distance:   Left Eye Distance:   Bilateral Distance:    Right Eye Near:   Left Eye Near:    Bilateral Near:     Physical Exam  GEN: alert, well appearing female, in no acute distress  EYES: no scleral injection or discharge CV: regular rate, brisk cap refill  RESP: no increased work of breathing MSK: no extremity edema, good ROM of extremities  SKIN: warm and dry; blistering erythematous papules, patches and streaks on bilateral upper and lower extremities  UC Treatments / Results  Labs (all labs ordered are listed, but only abnormal results are displayed) Labs Reviewed - No data to display  EKG   Radiology No results found.  Procedures Procedures (including critical care time)  Medications Ordered in UC Medications  dexamethasone (DECADRON) injection 10 mg (10 mg Intramuscular Given 12/07/23 1414)    Initial Impression / Assessment and Plan / UC Course  I have reviewed the triage vital signs and the nursing notes.  Pertinent labs & imaging results that were available during my care of the patient were reviewed by me and considered in my medical decision making (see chart for details).     Patient is a 63 y.o. femalewho presents for rash.  Overall, patient is well-appearing and well-hydrated.  Vital signs stable.  Yolandra is afebrile.  Exam concerning for contact dermatitis.  Decadron 10 mg IM given.  Treat with steroid taper and  steroid  ointment. No sign of infection to suggest antifungal or antibiotics at this time.     Reviewed expectations regarding course of current medical issues.  All questions asked were answered.  Outlined signs and symptoms indicating need for more acute intervention. Patient verbalized understanding. After Visit Summary given.   Final Clinical Impressions(s) / UC Diagnoses   Final diagnoses:  Irritant contact dermatitis due to oils     Discharge Instructions      Stop by the pharmacy to pick up your prescriptions. Start your prednisone taper tomorrow as you got a steroid injection today. Benadryl, Clartin/ Zyrtec and applying ice can provide some additional itch relief.    Follow up with your primary care provider or return to the urgent care, if not improving.       ED Prescriptions     Medication Sig Dispense Auth. Provider   predniSONE (STERAPRED UNI-PAK 21 TAB) 10 MG (21) TBPK tablet Take by mouth daily. Take 6 tabs by mouth daily for 1, then 5 tabs for 1 day, then 4 tabs for 1 day, then 3 tabs for 1 day, then 2 tabs for 1 day, then 1 tab for 1 day. 21 tablet Ishmail Mcmanamon, DO   triamcinolone ointment (KENALOG) 0.1 % Apply 1 Application topically 2 (two) times daily. 30 g Katha Cabal, DO      PDMP not reviewed this encounter.              Katha Cabal, DO 12/09/23 1912

## 2023-12-07 NOTE — Discharge Instructions (Addendum)
 Stop by the pharmacy to pick up your prescriptions. Start your prednisone taper tomorrow as you got a steroid injection today. Benadryl, Clartin/ Zyrtec and applying ice can provide some additional itch relief.    Follow up with your primary care provider or return to the urgent care, if not improving.

## 2023-12-07 NOTE — ED Triage Notes (Signed)
 Pt presents with a rash on bilateral legs and arms x 3 days. The rash became worse yesterday. She just returned from a trip to  [place], [place]
